# Patient Record
Sex: Male | Born: 1990 | Race: Black or African American | Hispanic: No | Marital: Single | Smoking: Never smoker
Health system: Southern US, Community
[De-identification: ages and names within clinical notes are randomized; demographics above are authoritative.]

## PROBLEM LIST (undated history)

## (undated) HISTORY — PX: BACK SURGERY: SHX140

---
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
## 2011-03-24 ENCOUNTER — Emergency Department (HOSPITAL_COMMUNITY): Payer: No Typology Code available for payment source

## 2011-03-24 ENCOUNTER — Inpatient Hospital Stay (HOSPITAL_COMMUNITY)
Admission: EM | Admit: 2011-03-24 | Discharge: 2011-04-16 | DRG: 981 | Disposition: A | Discharge status: Rehabilitation Facility | Payer: No Typology Code available for payment source | Attending: Surgery | Admitting: Surgery

## 2011-03-24 DIAGNOSIS — S27329A Contusion of lung, unspecified, initial encounter: Secondary | ICD-10-CM | POA: Diagnosis present

## 2011-03-24 DIAGNOSIS — I10 Essential (primary) hypertension: Secondary | ICD-10-CM | POA: Diagnosis not present

## 2011-03-24 DIAGNOSIS — E872 Acidosis, unspecified: Secondary | ICD-10-CM | POA: Diagnosis not present

## 2011-03-24 DIAGNOSIS — J9600 Acute respiratory failure, unspecified whether with hypoxia or hypercapnia: Secondary | ICD-10-CM | POA: Diagnosis present

## 2011-03-24 DIAGNOSIS — A0472 Enterocolitis due to Clostridium difficile, not specified as recurrent: Secondary | ICD-10-CM | POA: Diagnosis not present

## 2011-03-24 DIAGNOSIS — R509 Fever, unspecified: Secondary | ICD-10-CM | POA: Diagnosis not present

## 2011-03-24 DIAGNOSIS — S300XXA Contusion of lower back and pelvis, initial encounter: Secondary | ICD-10-CM | POA: Diagnosis present

## 2011-03-24 DIAGNOSIS — D62 Acute posthemorrhagic anemia: Secondary | ICD-10-CM | POA: Diagnosis present

## 2011-03-24 DIAGNOSIS — E46 Unspecified protein-calorie malnutrition: Secondary | ICD-10-CM | POA: Diagnosis not present

## 2011-03-24 DIAGNOSIS — D72829 Elevated white blood cell count, unspecified: Secondary | ICD-10-CM | POA: Diagnosis not present

## 2011-03-24 DIAGNOSIS — E876 Hypokalemia: Secondary | ICD-10-CM | POA: Diagnosis not present

## 2011-03-24 DIAGNOSIS — D696 Thrombocytopenia, unspecified: Secondary | ICD-10-CM | POA: Diagnosis not present

## 2011-03-24 DIAGNOSIS — R17 Unspecified jaundice: Secondary | ICD-10-CM | POA: Diagnosis not present

## 2011-03-24 DIAGNOSIS — Z8709 Personal history of other diseases of the respiratory system: Secondary | ICD-10-CM

## 2011-03-24 DIAGNOSIS — S36113A Laceration of liver, unspecified degree, initial encounter: Secondary | ICD-10-CM

## 2011-03-24 DIAGNOSIS — J95821 Acute postprocedural respiratory failure: Secondary | ICD-10-CM

## 2011-03-24 DIAGNOSIS — IMO0002 Reserved for concepts with insufficient information to code with codable children: Secondary | ICD-10-CM | POA: Diagnosis not present

## 2011-03-24 DIAGNOSIS — S32009A Unspecified fracture of unspecified lumbar vertebra, initial encounter for closed fracture: Secondary | ICD-10-CM | POA: Diagnosis present

## 2011-03-24 DIAGNOSIS — J151 Pneumonia due to Pseudomonas: Secondary | ICD-10-CM | POA: Diagnosis not present

## 2011-03-24 DIAGNOSIS — E861 Hypovolemia: Secondary | ICD-10-CM | POA: Diagnosis not present

## 2011-03-24 DIAGNOSIS — T17308A Unspecified foreign body in larynx causing other injury, initial encounter: Secondary | ICD-10-CM | POA: Diagnosis not present

## 2011-03-24 DIAGNOSIS — S272XXA Traumatic hemopneumothorax, initial encounter: Secondary | ICD-10-CM

## 2011-03-24 DIAGNOSIS — E874 Mixed disorder of acid-base balance: Secondary | ICD-10-CM | POA: Diagnosis not present

## 2011-03-24 DIAGNOSIS — N170 Acute kidney failure with tubular necrosis: Secondary | ICD-10-CM | POA: Diagnosis not present

## 2011-03-24 DIAGNOSIS — J9383 Other pneumothorax: Principal | ICD-10-CM | POA: Diagnosis present

## 2011-03-24 LAB — TROPONIN I: Troponin I: 3.32 ng/mL (ref <0.30)

## 2011-03-24 LAB — COMPREHENSIVE METABOLIC PANEL
Alkaline Phosphatase: 70 U/L (ref 39–117)
BUN: 13 mg/dL (ref 6–23)
Calcium: 8.2 mg/dL — ABNORMAL LOW (ref 8.4–10.5)
Creatinine, Ser: 1.21 mg/dL (ref 0.50–1.35)
GFR calc Af Amer: >60 mL/min (ref >60)
Glucose, Bld: 179 mg/dL — ABNORMAL HIGH (ref 70–99)
Potassium: 3.5 meq/L (ref 3.5–5.1)
Total Protein: 5.8 g/dL — ABNORMAL LOW (ref 6.0–8.3)

## 2011-03-24 LAB — CBC
HCT: 32.7 % — ABNORMAL LOW (ref 39.0–52.0)
Hemoglobin: 10.8 g/dL — ABNORMAL LOW (ref 13.0–17.0)
MCH: 29 pg (ref 26.0–34.0)
MCHC: 34.7 g/dL (ref 30.0–36.0)
MCV: 87.7 fL (ref 78.0–100.0)
Platelets: 129 10*3/uL — ABNORMAL LOW (ref 150–400)
RBC: 3.73 MIL/uL — ABNORMAL LOW (ref 4.22–5.81)
RDW: 14.1 % (ref 11.5–15.5)
WBC: 13.4 10*3/uL — ABNORMAL HIGH (ref 4.0–10.5)
WBC: 11.1 10*3/uL — ABNORMAL HIGH (ref 4.0–10.5)

## 2011-03-24 LAB — CK TOTAL AND CKMB (NOT AT ARMC)
CK, MB: 13.1 ng/mL (ref 0.3–4.0)
Relative Index: 2.3 (ref 0.0–2.5)
Total CK: 570 U/L — ABNORMAL HIGH (ref 7–232)

## 2011-03-24 LAB — POCT I-STAT, CHEM 8
BUN: 15 mg/dL (ref 6–23)
Chloride: 104 meq/L (ref 96–112)
Creatinine, Ser: 1.4 mg/dL — ABNORMAL HIGH (ref 0.50–1.35)
Sodium: 143 meq/L (ref 135–145)

## 2011-03-24 LAB — POCT I-STAT 3, ART BLOOD GAS (G3+)
O2 Saturation: 89 %
TCO2: 22 mmol/L (ref 0–100)
pCO2 arterial: 39.8 mmHg (ref 35.0–45.0)
pO2, Arterial: 61 mmHg — ABNORMAL LOW (ref 80.0–100.0)

## 2011-03-24 LAB — RAPID URINE DRUG SCREEN, HOSP PERFORMED
Amphetamines: NOT DETECTED
Cocaine: NOT DETECTED
Opiates: NOT DETECTED
Tetrahydrocannabinol: POSITIVE — AB

## 2011-03-24 LAB — HEMOGLOBIN AND HEMATOCRIT, BLOOD
HCT: 30.5 % — ABNORMAL LOW (ref 39.0–52.0)
Hemoglobin: 9.5 g/dL — ABNORMAL LOW (ref 13.0–17.0)
Hemoglobin: 11 g/dL — ABNORMAL LOW (ref 13.0–17.0)
Hemoglobin: 10.7 g/dL — ABNORMAL LOW (ref 13.0–17.0)

## 2011-03-24 LAB — BASIC METABOLIC PANEL
BUN: 13 mg/dL (ref 6–23)
Chloride: 111 mEq/L (ref 96–112)
GFR calc Af Amer: >60 mL/min (ref >60)
GFR calc non Af Amer: >60 mL/min (ref >60)
Potassium: 3.6 mEq/L (ref 3.5–5.1)
Sodium: 141 mEq/L (ref 135–145)

## 2011-03-24 LAB — LACTIC ACID, PLASMA: Lactic Acid, Venous: 8.3 mmol/L — ABNORMAL HIGH (ref 0.5–2.2)

## 2011-03-24 LAB — URINALYSIS, ROUTINE W REFLEX MICROSCOPIC
Ketones, ur: 15 mg/dL — AB
Nitrite: NEGATIVE
pH: 5.5 (ref 5.0–8.0)

## 2011-03-24 LAB — ABO/RH: ABO/RH(D): O POS

## 2011-03-24 LAB — URINE MICROSCOPIC-ADD ON

## 2011-03-24 MED ORDER — IOHEXOL 300 MG/ML  SOLN
100.0000 mL | Freq: Once | INTRAMUSCULAR | Status: AC | PRN
  Administered 2011-03-24: 100 mL via INTRAVENOUS

## 2011-03-25 ENCOUNTER — Inpatient Hospital Stay (HOSPITAL_COMMUNITY): Payer: No Typology Code available for payment source

## 2011-03-25 LAB — CBC
Hemoglobin: 10.4 g/dL — ABNORMAL LOW (ref 13.0–17.0)
MCH: 30 pg (ref 26.0–34.0)
MCHC: 35 g/dL (ref 30.0–36.0)
RDW: 13.7 % (ref 11.5–15.5)

## 2011-03-25 LAB — POCT I-STAT 3, ART BLOOD GAS (G3+)
Acid-Base Excess: 1 mmol/L (ref 0.0–2.0)
Acid-Base Excess: 2 mmol/L (ref 0.0–2.0)
Acid-Base Excess: 3 mmol/L — ABNORMAL HIGH (ref 0.0–2.0)
Bicarbonate: 25.9 meq/L — ABNORMAL HIGH (ref 20.0–24.0)
Bicarbonate: 28.5 meq/L — ABNORMAL HIGH (ref 20.0–24.0)
O2 Saturation: 94 %
Patient temperature: 98.9
Patient temperature: 99
Patient temperature: 100.4
TCO2: 27 mmol/L (ref 0–100)
TCO2: 30 mmol/L (ref 0–100)
TCO2: 30 mmol/L (ref 0–100)

## 2011-03-25 LAB — PHOSPHORUS: Phosphorus: 3.2 mg/dL (ref 2.3–4.6)

## 2011-03-25 LAB — BASIC METABOLIC PANEL
BUN: 12 mg/dL (ref 6–23)
Calcium: 8.1 mg/dL — ABNORMAL LOW (ref 8.4–10.5)
GFR calc Af Amer: >60 mL/min (ref >60)
GFR calc non Af Amer: >60 mL/min (ref >60)
Glucose, Bld: 127 mg/dL — ABNORMAL HIGH (ref 70–99)
Sodium: 137 mEq/L (ref 135–145)

## 2011-03-26 ENCOUNTER — Inpatient Hospital Stay (HOSPITAL_COMMUNITY): Payer: No Typology Code available for payment source

## 2011-03-26 DIAGNOSIS — S27329A Contusion of lung, unspecified, initial encounter: Secondary | ICD-10-CM

## 2011-03-26 DIAGNOSIS — J9819 Other pulmonary collapse: Secondary | ICD-10-CM

## 2011-03-26 DIAGNOSIS — J9600 Acute respiratory failure, unspecified whether with hypoxia or hypercapnia: Secondary | ICD-10-CM

## 2011-03-26 LAB — POCT I-STAT 3, ART BLOOD GAS (G3+)
Acid-base deficit: 1 mmol/L (ref 0.0–2.0)
Acid-base deficit: 2 mmol/L (ref 0.0–2.0)
Acid-base deficit: 3 mmol/L — ABNORMAL HIGH (ref 0.0–2.0)
Bicarbonate: 26.4 meq/L — ABNORMAL HIGH (ref 20.0–24.0)
Bicarbonate: 26.6 meq/L — ABNORMAL HIGH (ref 20.0–24.0)
Bicarbonate: 23.9 meq/L (ref 20.0–24.0)
Bicarbonate: 24 meq/L (ref 20.0–24.0)
O2 Saturation: 92 %
O2 Saturation: 69 %
O2 Saturation: 89 %
O2 Saturation: 100 %
O2 Saturation: 99 %
Patient temperature: 100.6
Patient temperature: 99
Patient temperature: 99
Patient temperature: 99
Patient temperature: 100.4
Patient temperature: 100.4
TCO2: 26 mmol/L (ref 0–100)
TCO2: 28 mmol/L (ref 0–100)
TCO2: 28 mmol/L (ref 0–100)
TCO2: 26 mmol/L (ref 0–100)
TCO2: 26 mmol/L (ref 0–100)
pCO2 arterial: 60.6 mmHg (ref 35.0–45.0)
pCO2 arterial: 68.6 mmHg (ref 35.0–45.0)
pCO2 arterial: 66.6 mmHg (ref 35.0–45.0)
pCO2 arterial: 52.4 mmHg — ABNORMAL HIGH (ref 35.0–45.0)
pH, Arterial: 7.249 — ABNORMAL LOW (ref 7.350–7.450)
pH, Arterial: 7.151 — CL (ref 7.350–7.450)
pH, Arterial: 7.198 — CL (ref 7.350–7.450)
pH, Arterial: 7.271 — ABNORMAL LOW (ref 7.350–7.450)
pO2, Arterial: 18 mmHg — CL (ref 80.0–100.0)
pO2, Arterial: 69 mmHg — ABNORMAL LOW (ref 80.0–100.0)
pO2, Arterial: 72 mmHg — ABNORMAL LOW (ref 80.0–100.0)
pO2, Arterial: 156 mmHg — ABNORMAL HIGH (ref 80.0–100.0)

## 2011-03-26 LAB — BASIC METABOLIC PANEL
BUN: 9 mg/dL (ref 6–23)
CO2: 26 mEq/L (ref 19–32)
Calcium: 8.6 mg/dL (ref 8.4–10.5)
Calcium: 8.2 mg/dL — ABNORMAL LOW (ref 8.4–10.5)
Chloride: 107 mEq/L (ref 96–112)
Creatinine, Ser: 0.81 mg/dL (ref 0.50–1.35)
Creatinine, Ser: 0.78 mg/dL (ref 0.50–1.35)
Creatinine, Ser: 1.2 mg/dL (ref 0.50–1.35)
GFR calc Af Amer: >60 mL/min (ref >60)
GFR calc Af Amer: >60 mL/min (ref >60)
GFR calc non Af Amer: >60 mL/min (ref >60)
GFR calc non Af Amer: >60 mL/min (ref >60)
GFR calc non Af Amer: >60 mL/min (ref >60)
Glucose, Bld: 141 mg/dL — ABNORMAL HIGH (ref 70–99)
Glucose, Bld: 123 mg/dL — ABNORMAL HIGH (ref 70–99)
Potassium: 3.5 mEq/L (ref 3.5–5.1)
Potassium: 5 mEq/L (ref 3.5–5.1)
Sodium: 135 mEq/L (ref 135–145)

## 2011-03-26 LAB — POCT I-STAT 3, VENOUS BLOOD GAS (G3P V)
Acid-base deficit: 3 mmol/L — ABNORMAL HIGH (ref 0.0–2.0)
Bicarbonate: 25.8 meq/L — ABNORMAL HIGH (ref 20.0–24.0)
O2 Saturation: 69 %
Patient temperature: 99.1

## 2011-03-26 LAB — APTT
aPTT: 25 seconds (ref 24–37)
aPTT: 31 seconds (ref 24–37)

## 2011-03-26 LAB — DIFFERENTIAL
Basophils Absolute: 0 10*3/uL (ref 0.0–0.1)
Basophils Relative: 0 % (ref 0–1)
Eosinophils Relative: 2 % (ref 0–5)
Lymphocytes Relative: 8 % — ABNORMAL LOW (ref 12–46)
Monocytes Absolute: 0.9 10*3/uL (ref 0.1–1.0)

## 2011-03-26 LAB — CBC
HCT: 25.2 % — ABNORMAL LOW (ref 39.0–52.0)
Hemoglobin: 9.3 g/dL — ABNORMAL LOW (ref 13.0–17.0)
MCH: 30.4 pg (ref 26.0–34.0)
MCHC: 34.9 g/dL (ref 30.0–36.0)
MCHC: 35.1 g/dL (ref 30.0–36.0)
MCV: 88.5 fL (ref 78.0–100.0)
Platelets: 116 10*3/uL — ABNORMAL LOW (ref 150–400)
Platelets: 129 10*3/uL — ABNORMAL LOW (ref 150–400)
RBC: 2.86 MIL/uL — ABNORMAL LOW (ref 4.22–5.81)
RDW: 13.9 % (ref 11.5–15.5)
RDW: 13.9 % (ref 11.5–15.5)
RDW: 13.8 % (ref 11.5–15.5)
WBC: 11.9 10*3/uL — ABNORMAL HIGH (ref 4.0–10.5)

## 2011-03-26 LAB — PROTIME-INR
Prothrombin Time: 17.5 seconds — ABNORMAL HIGH (ref 11.6–15.2)
Prothrombin Time: 18 seconds — ABNORMAL HIGH (ref 11.6–15.2)

## 2011-03-27 ENCOUNTER — Inpatient Hospital Stay (HOSPITAL_COMMUNITY): Payer: No Typology Code available for payment source

## 2011-03-27 LAB — GLUCOSE, CAPILLARY
Glucose-Capillary: 101 mg/dL — ABNORMAL HIGH (ref 70–99)
Glucose-Capillary: 110 mg/dL — ABNORMAL HIGH (ref 70–99)
Glucose-Capillary: 111 mg/dL — ABNORMAL HIGH (ref 70–99)
Glucose-Capillary: 120 mg/dL — ABNORMAL HIGH (ref 70–99)

## 2011-03-27 LAB — CBC
Platelets: 97 10*3/uL — ABNORMAL LOW (ref 150–400)
RBC: 2.55 MIL/uL — ABNORMAL LOW (ref 4.22–5.81)
WBC: 7.8 10*3/uL (ref 4.0–10.5)

## 2011-03-27 LAB — POCT I-STAT 3, ART BLOOD GAS (G3+)
Acid-base deficit: 4 mmol/L — ABNORMAL HIGH (ref 0.0–2.0)
Bicarbonate: 23.7 meq/L (ref 20.0–24.0)
Bicarbonate: 25.6 meq/L — ABNORMAL HIGH (ref 20.0–24.0)
Bicarbonate: 23.2 meq/L (ref 20.0–24.0)
O2 Saturation: 94 %
O2 Saturation: 91 %
O2 Saturation: 96 %
O2 Saturation: 95 %
Patient temperature: 97.7
Patient temperature: 97.5
TCO2: 25 mmol/L (ref 0–100)
TCO2: 25 mmol/L (ref 0–100)
TCO2: 25 mmol/L (ref 0–100)
pCO2 arterial: 48.4 mmHg — ABNORMAL HIGH (ref 35.0–45.0)
pCO2 arterial: 47.9 mmHg — ABNORMAL HIGH (ref 35.0–45.0)
pCO2 arterial: 49.7 mmHg — ABNORMAL HIGH (ref 35.0–45.0)
pCO2 arterial: 45 mmHg (ref 35.0–45.0)
pCO2 arterial: 42.5 mmHg (ref 35.0–45.0)
pH, Arterial: 7.293 — ABNORMAL LOW (ref 7.350–7.450)
pH, Arterial: 7.301 — ABNORMAL LOW (ref 7.350–7.450)
pH, Arterial: 7.288 — ABNORMAL LOW (ref 7.350–7.450)
pH, Arterial: 7.288 — ABNORMAL LOW (ref 7.350–7.450)
pH, Arterial: 7.319 — ABNORMAL LOW (ref 7.350–7.450)
pH, Arterial: 7.384 (ref 7.350–7.450)
pO2, Arterial: 82 mmHg (ref 80.0–100.0)
pO2, Arterial: 64 mmHg — ABNORMAL LOW (ref 80.0–100.0)

## 2011-03-27 LAB — BASIC METABOLIC PANEL
CO2: 25 mEq/L (ref 19–32)
Chloride: 109 mEq/L (ref 96–112)
Glucose, Bld: 128 mg/dL — ABNORMAL HIGH (ref 70–99)
Potassium: 4.5 mEq/L (ref 3.5–5.1)
Sodium: 139 mEq/L (ref 135–145)

## 2011-03-27 LAB — HEMOGLOBIN A1C
Hgb A1c MFr Bld: 5.7 % — ABNORMAL HIGH (ref <5.7)
Mean Plasma Glucose: 117 mg/dL — ABNORMAL HIGH (ref <117)

## 2011-03-27 LAB — PROTIME-INR: Prothrombin Time: 17.3 seconds — ABNORMAL HIGH (ref 11.6–15.2)

## 2011-03-27 LAB — APTT: aPTT: 30 seconds (ref 24–37)

## 2011-03-28 ENCOUNTER — Inpatient Hospital Stay (HOSPITAL_COMMUNITY): Payer: No Typology Code available for payment source

## 2011-03-28 DIAGNOSIS — S27329A Contusion of lung, unspecified, initial encounter: Secondary | ICD-10-CM

## 2011-03-28 DIAGNOSIS — J9600 Acute respiratory failure, unspecified whether with hypoxia or hypercapnia: Secondary | ICD-10-CM

## 2011-03-28 DIAGNOSIS — J9819 Other pulmonary collapse: Secondary | ICD-10-CM

## 2011-03-28 LAB — POCT I-STAT 3, ART BLOOD GAS (G3+)
Acid-base deficit: 4 mmol/L — ABNORMAL HIGH (ref 0.0–2.0)
Acid-base deficit: 4 mmol/L — ABNORMAL HIGH (ref 0.0–2.0)
Acid-base deficit: 4 mmol/L — ABNORMAL HIGH (ref 0.0–2.0)
Acid-base deficit: 4 mmol/L — ABNORMAL HIGH (ref 0.0–2.0)
Acid-base deficit: 5 mmol/L — ABNORMAL HIGH (ref 0.0–2.0)
Acid-base deficit: 6 mmol/L — ABNORMAL HIGH (ref 0.0–2.0)
Bicarbonate: 22.3 meq/L (ref 20.0–24.0)
Bicarbonate: 21.3 meq/L (ref 20.0–24.0)
Bicarbonate: 21.2 meq/L (ref 20.0–24.0)
Bicarbonate: 21.3 meq/L (ref 20.0–24.0)
Bicarbonate: 21.3 meq/L (ref 20.0–24.0)
O2 Saturation: 90 %
O2 Saturation: 93 %
O2 Saturation: 97 %
O2 Saturation: 95 %
O2 Saturation: 91 %
Patient temperature: 97.1
Patient temperature: 97.9
TCO2: 24 mmol/L (ref 0–100)
TCO2: 22 mmol/L (ref 0–100)
TCO2: 22 mmol/L (ref 0–100)
TCO2: 22 mmol/L (ref 0–100)
TCO2: 22 mmol/L (ref 0–100)
TCO2: 21 mmol/L (ref 0–100)
pCO2 arterial: 36.7 mmHg (ref 35.0–45.0)
pCO2 arterial: 35.7 mmHg (ref 35.0–45.0)
pCO2 arterial: 32.4 mmHg — ABNORMAL LOW (ref 35.0–45.0)
pCO2 arterial: 31.6 mmHg — ABNORMAL LOW (ref 35.0–45.0)
pCO2 arterial: 46.1 mmHg — ABNORMAL HIGH (ref 35.0–45.0)
pH, Arterial: 7.27 — ABNORMAL LOW (ref 7.350–7.450)
pH, Arterial: 7.282 — ABNORMAL LOW (ref 7.350–7.450)
pH, Arterial: 7.393 (ref 7.350–7.450)
pO2, Arterial: 40 mmHg — ABNORMAL LOW (ref 80.0–100.0)
pO2, Arterial: 47 mmHg — ABNORMAL LOW (ref 80.0–100.0)
pO2, Arterial: 53 mmHg — ABNORMAL LOW (ref 80.0–100.0)
pO2, Arterial: 59 mmHg — ABNORMAL LOW (ref 80.0–100.0)
pO2, Arterial: 64 mmHg — ABNORMAL LOW (ref 80.0–100.0)
pO2, Arterial: 66 mmHg — ABNORMAL LOW (ref 80.0–100.0)
pO2, Arterial: 87 mmHg (ref 80.0–100.0)

## 2011-03-28 LAB — TYPE AND SCREEN
ABO/RH(D): O POS
Antibody Screen: NEGATIVE
Unit division: 0
Unit division: 0
Unit division: 0
Unit division: 0
Unit division: 0
Unit division: 0

## 2011-03-28 LAB — DIFFERENTIAL
Basophils Relative: 0 % (ref 0–1)
Eosinophils Absolute: 0.2 10*3/uL (ref 0.0–0.7)
Lymphs Abs: 0.6 10*3/uL — ABNORMAL LOW (ref 0.7–4.0)
Monocytes Absolute: 1.1 10*3/uL — ABNORMAL HIGH (ref 0.1–1.0)
Monocytes Relative: 18 % — ABNORMAL HIGH (ref 3–12)

## 2011-03-28 LAB — VANCOMYCIN, TROUGH: Vancomycin Tr: 61.3 ug/mL (ref 10.0–20.0)

## 2011-03-28 LAB — COMPREHENSIVE METABOLIC PANEL
Albumin: 2.6 g/dL — ABNORMAL LOW (ref 3.5–5.2)
Alkaline Phosphatase: 51 U/L (ref 39–117)
BUN: 16 mg/dL (ref 6–23)
Chloride: 110 mEq/L (ref 96–112)
Creatinine, Ser: 1.15 mg/dL (ref 0.50–1.35)
GFR calc Af Amer: >60 mL/min (ref >60)
Glucose, Bld: 128 mg/dL — ABNORMAL HIGH (ref 70–99)
Potassium: 4.4 mEq/L (ref 3.5–5.1)
Total Bilirubin: 1.1 mg/dL (ref 0.3–1.2)
Total Protein: 5.4 g/dL — ABNORMAL LOW (ref 6.0–8.3)

## 2011-03-28 LAB — BASIC METABOLIC PANEL
CO2: 22 meq/L (ref 19–32)
Calcium: 8.7 mg/dL (ref 8.4–10.5)
Chloride: 109 meq/L (ref 96–112)
Potassium: 4.4 meq/L (ref 3.5–5.1)
Sodium: 140 meq/L (ref 135–145)

## 2011-03-28 LAB — CBC
MCH: 30 pg (ref 26.0–34.0)
MCHC: 33.8 g/dL (ref 30.0–36.0)
MCV: 88.9 fL (ref 78.0–100.0)
Platelets: 98 10*3/uL — ABNORMAL LOW (ref 150–400)

## 2011-03-28 LAB — GLUCOSE, CAPILLARY
Glucose-Capillary: 78 mg/dL (ref 70–99)
Glucose-Capillary: 104 mg/dL — ABNORMAL HIGH (ref 70–99)

## 2011-03-28 LAB — SODIUM, URINE, RANDOM: Sodium, Ur: 112 meq/L

## 2011-03-28 LAB — CREATININE, URINE, RANDOM: Creatinine, Urine: 20.12 mg/dL

## 2011-03-28 LAB — CHOLESTEROL, TOTAL: Cholesterol: 81 mg/dL (ref 0–200)

## 2011-03-28 LAB — MAGNESIUM: Magnesium: 2.3 mg/dL (ref 1.5–2.5)

## 2011-03-29 ENCOUNTER — Inpatient Hospital Stay (HOSPITAL_COMMUNITY): Payer: No Typology Code available for payment source

## 2011-03-29 DIAGNOSIS — S27329A Contusion of lung, unspecified, initial encounter: Secondary | ICD-10-CM

## 2011-03-29 DIAGNOSIS — J9600 Acute respiratory failure, unspecified whether with hypoxia or hypercapnia: Secondary | ICD-10-CM

## 2011-03-29 DIAGNOSIS — J9819 Other pulmonary collapse: Secondary | ICD-10-CM

## 2011-03-29 LAB — BLOOD GAS, ARTERIAL
Bicarbonate: 21.6 mEq/L (ref 20.0–24.0)
O2 Saturation: 90.7 %
Patient temperature: 98.6
TCO2: 23.1 mmol/L (ref 0–100)

## 2011-03-29 LAB — POCT I-STAT 3, ART BLOOD GAS (G3+)
Acid-base deficit: 6 mmol/L — ABNORMAL HIGH (ref 0.0–2.0)
Acid-base deficit: 6 mmol/L — ABNORMAL HIGH (ref 0.0–2.0)
Acid-base deficit: 6 mmol/L — ABNORMAL HIGH (ref 0.0–2.0)
Acid-base deficit: 7 mmol/L — ABNORMAL HIGH (ref 0.0–2.0)
Acid-base deficit: 7 mmol/L — ABNORMAL HIGH (ref 0.0–2.0)
Acid-base deficit: 7 mmol/L — ABNORMAL HIGH (ref 0.0–2.0)
Bicarbonate: 19.8 meq/L — ABNORMAL LOW (ref 20.0–24.0)
Bicarbonate: 20.2 meq/L (ref 20.0–24.0)
Bicarbonate: 20.3 meq/L (ref 20.0–24.0)
O2 Saturation: 100 %
O2 Saturation: 80 %
O2 Saturation: 86 %
O2 Saturation: 90 %
O2 Saturation: 97 %
O2 Saturation: 98 %
Patient temperature: 35.2
Patient temperature: 35.9
TCO2: 21 mmol/L (ref 0–100)
TCO2: 21 mmol/L (ref 0–100)
TCO2: 22 mmol/L (ref 0–100)
TCO2: 22 mmol/L (ref 0–100)
pCO2 arterial: 48.3 mmHg — ABNORMAL HIGH (ref 35.0–45.0)
pCO2 arterial: 46.9 mmHg — ABNORMAL HIGH (ref 35.0–45.0)
pCO2 arterial: 41.3 mmHg (ref 35.0–45.0)
pCO2 arterial: 41.1 mmHg (ref 35.0–45.0)
pCO2 arterial: 45.6 mmHg — ABNORMAL HIGH (ref 35.0–45.0)
pH, Arterial: 7.226 — ABNORMAL LOW (ref 7.350–7.450)
pO2, Arterial: 48 mmHg — ABNORMAL LOW (ref 80.0–100.0)
pO2, Arterial: 104 mmHg — ABNORMAL HIGH (ref 80.0–100.0)
pO2, Arterial: 58 mmHg — ABNORMAL LOW (ref 80.0–100.0)
pO2, Arterial: 108 mmHg — ABNORMAL HIGH (ref 80.0–100.0)
pO2, Arterial: 149 mmHg — ABNORMAL HIGH (ref 80.0–100.0)
pO2, Arterial: 99 mmHg (ref 80.0–100.0)

## 2011-03-29 LAB — PROTIME-INR: Prothrombin Time: 17.5 seconds — ABNORMAL HIGH (ref 11.6–15.2)

## 2011-03-29 LAB — RENAL FUNCTION PANEL
BUN: 33 mg/dL — ABNORMAL HIGH (ref 6–23)
CO2: 23 mEq/L (ref 19–32)
Chloride: 109 mEq/L (ref 96–112)
Creatinine, Ser: 3.12 mg/dL — ABNORMAL HIGH (ref 0.50–1.35)
GFR calc non Af Amer: 26 mL/min — ABNORMAL LOW (ref >60)

## 2011-03-29 LAB — CBC
HCT: 25.1 % — ABNORMAL LOW (ref 39.0–52.0)
MCV: 86.9 fL (ref 78.0–100.0)
Platelets: 112 10*3/uL — ABNORMAL LOW (ref 150–400)
RBC: 2.89 MIL/uL — ABNORMAL LOW (ref 4.22–5.81)
WBC: 8.2 10*3/uL (ref 4.0–10.5)

## 2011-03-29 LAB — GLUCOSE, CAPILLARY

## 2011-03-29 LAB — VANCOMYCIN, RANDOM: Vancomycin Rm: 45.9 ug/mL

## 2011-03-30 ENCOUNTER — Inpatient Hospital Stay (HOSPITAL_COMMUNITY): Payer: No Typology Code available for payment source

## 2011-03-30 LAB — POCT I-STAT 3, ART BLOOD GAS (G3+)
Acid-base deficit: 8 mmol/L — ABNORMAL HIGH (ref 0.0–2.0)
Acid-base deficit: 9 mmol/L — ABNORMAL HIGH (ref 0.0–2.0)
Acid-base deficit: 9 mmol/L — ABNORMAL HIGH (ref 0.0–2.0)
Bicarbonate: 21 meq/L (ref 20.0–24.0)
Bicarbonate: 19.3 meq/L — ABNORMAL LOW (ref 20.0–24.0)
Bicarbonate: 19 meq/L — ABNORMAL LOW (ref 20.0–24.0)
O2 Saturation: 96 %
O2 Saturation: 94 %
O2 Saturation: 93 %
O2 Saturation: 96 %
O2 Saturation: 97 %
Patient temperature: 37.3
TCO2: 23 mmol/L (ref 0–100)
TCO2: 21 mmol/L (ref 0–100)
TCO2: 19 mmol/L (ref 0–100)
TCO2: 19 mmol/L (ref 0–100)
pCO2 arterial: 77.4 mmHg (ref 35.0–45.0)
pCO2 arterial: 50.4 mmHg — ABNORMAL HIGH (ref 35.0–45.0)
pCO2 arterial: 33.7 mmHg — ABNORMAL LOW (ref 35.0–45.0)
pCO2 arterial: 40 mmHg (ref 35.0–45.0)
pCO2 arterial: 33.3 mmHg — ABNORMAL LOW (ref 35.0–45.0)
pCO2 arterial: 34.6 mmHg — ABNORMAL LOW (ref 35.0–45.0)
pH, Arterial: 7.329 — ABNORMAL LOW (ref 7.350–7.450)
pH, Arterial: 7.261 — ABNORMAL LOW (ref 7.350–7.450)
pH, Arterial: 7.354 (ref 7.350–7.450)
pH, Arterial: 7.368 (ref 7.350–7.450)
pO2, Arterial: 113 mmHg — ABNORMAL HIGH (ref 80.0–100.0)
pO2, Arterial: 100 mmHg (ref 80.0–100.0)
pO2, Arterial: 81 mmHg (ref 80.0–100.0)
pO2, Arterial: 83 mmHg (ref 80.0–100.0)
pO2, Arterial: 71 mmHg — ABNORMAL LOW (ref 80.0–100.0)
pO2, Arterial: 114 mmHg — ABNORMAL HIGH (ref 80.0–100.0)

## 2011-03-30 LAB — GLUCOSE, CAPILLARY
Glucose-Capillary: 123 mg/dL — ABNORMAL HIGH (ref 70–99)
Glucose-Capillary: 128 mg/dL — ABNORMAL HIGH (ref 70–99)
Glucose-Capillary: 124 mg/dL — ABNORMAL HIGH (ref 70–99)

## 2011-03-30 LAB — URINALYSIS, ROUTINE W REFLEX MICROSCOPIC
Bilirubin Urine: NEGATIVE
Glucose, UA: NEGATIVE mg/dL
Ketones, ur: NEGATIVE mg/dL
Protein, ur: 30 mg/dL — AB
pH: 7 (ref 5.0–8.0)

## 2011-03-30 LAB — DIFFERENTIAL
Basophils Absolute: 0 10*3/uL (ref 0.0–0.1)
Basophils Relative: 1 % (ref 0–1)
Eosinophils Absolute: 0.2 10*3/uL (ref 0.0–0.7)
Neutro Abs: 5.5 10*3/uL (ref 1.7–7.7)
Neutrophils Relative %: 70 % (ref 43–77)

## 2011-03-30 LAB — MAGNESIUM: Magnesium: 2.7 mg/dL — ABNORMAL HIGH (ref 1.5–2.5)

## 2011-03-30 LAB — CBC
Hemoglobin: 7.9 g/dL — ABNORMAL LOW (ref 13.0–17.0)
Platelets: 130 10*3/uL — ABNORMAL LOW (ref 150–400)
RBC: 2.72 MIL/uL — ABNORMAL LOW (ref 4.22–5.81)
WBC: 7.9 10*3/uL (ref 4.0–10.5)

## 2011-03-30 LAB — RENAL FUNCTION PANEL
CO2: 21 mEq/L (ref 19–32)
GFR calc Af Amer: 19 mL/min — ABNORMAL LOW (ref >60)
GFR calc non Af Amer: 16 mL/min — ABNORMAL LOW (ref >60)
Glucose, Bld: 137 mg/dL — ABNORMAL HIGH (ref 70–99)
Phosphorus: 4.8 mg/dL — ABNORMAL HIGH (ref 2.3–4.6)
Potassium: 4.7 mEq/L (ref 3.5–5.1)
Sodium: 139 mEq/L (ref 135–145)

## 2011-03-30 LAB — CK TOTAL AND CKMB (NOT AT ARMC)
CK, MB: 2.7 ng/mL (ref 0.3–4.0)
Relative Index: 1.6 (ref 0.0–2.5)
Total CK: 164 U/L (ref 7–232)

## 2011-03-30 LAB — URINE MICROSCOPIC-ADD ON

## 2011-03-31 ENCOUNTER — Inpatient Hospital Stay (HOSPITAL_COMMUNITY): Payer: No Typology Code available for payment source

## 2011-03-31 LAB — POCT I-STAT 3, ART BLOOD GAS (G3+)
Acid-base deficit: 5 mmol/L — ABNORMAL HIGH (ref 0.0–2.0)
Acid-base deficit: 4 mmol/L — ABNORMAL HIGH (ref 0.0–2.0)
Acid-base deficit: 5 mmol/L — ABNORMAL HIGH (ref 0.0–2.0)
Acid-base deficit: 3 mmol/L — ABNORMAL HIGH (ref 0.0–2.0)
Acid-base deficit: 5 mmol/L — ABNORMAL HIGH (ref 0.0–2.0)
Acid-base deficit: 4 mmol/L — ABNORMAL HIGH (ref 0.0–2.0)
Acid-base deficit: 4 mmol/L — ABNORMAL HIGH (ref 0.0–2.0)
Bicarbonate: 20.8 meq/L (ref 20.0–24.0)
Bicarbonate: 21.3 meq/L (ref 20.0–24.0)
Bicarbonate: 26.4 meq/L — ABNORMAL HIGH (ref 20.0–24.0)
Bicarbonate: 22.9 meq/L (ref 20.0–24.0)
Bicarbonate: 23 meq/L (ref 20.0–24.0)
O2 Saturation: 92 %
O2 Saturation: 92 %
O2 Saturation: 100 %
O2 Saturation: 96 %
O2 Saturation: 96 %
Patient temperature: 36.6
Patient temperature: 36.8
Patient temperature: 37.3
Patient temperature: 36.8
TCO2: 22 mmol/L (ref 0–100)
TCO2: 23 mmol/L (ref 0–100)
TCO2: 29 mmol/L (ref 0–100)
TCO2: 25 mmol/L (ref 0–100)
TCO2: 28 mmol/L (ref 0–100)
TCO2: 24 mmol/L (ref 0–100)
pCO2 arterial: 59.1 mmHg (ref 35.0–45.0)
pCO2 arterial: 53.4 mmHg — ABNORMAL HIGH (ref 35.0–45.0)
pH, Arterial: 7.238 — ABNORMAL LOW (ref 7.350–7.450)
pH, Arterial: 7.266 — ABNORMAL LOW (ref 7.350–7.450)
pH, Arterial: 7.205 — ABNORMAL LOW (ref 7.350–7.450)
pO2, Arterial: 64 mmHg — ABNORMAL LOW (ref 80.0–100.0)
pO2, Arterial: 66 mmHg — ABNORMAL LOW (ref 80.0–100.0)
pO2, Arterial: 324 mmHg — ABNORMAL HIGH (ref 80.0–100.0)
pO2, Arterial: 101 mmHg — ABNORMAL HIGH (ref 80.0–100.0)
pO2, Arterial: 97 mmHg (ref 80.0–100.0)
pO2, Arterial: 73 mmHg — ABNORMAL LOW (ref 80.0–100.0)

## 2011-03-31 LAB — BLOOD GAS, ARTERIAL
Acid-base deficit: 3.5 mmol/L — ABNORMAL HIGH (ref 0.0–2.0)
Amplitude: 119
Drawn by: 252031
FIO2: 0.5 %
TCO2: 22.2 mmol/L (ref 0–100)
pCO2 arterial: 38 mmHg (ref 35.0–45.0)

## 2011-03-31 LAB — PHOSPHORUS: Phosphorus: 2.8 mg/dL (ref 2.3–4.6)

## 2011-03-31 LAB — BASIC METABOLIC PANEL
GFR calc Af Amer: 14 mL/min — ABNORMAL LOW (ref >60)
GFR calc non Af Amer: 12 mL/min — ABNORMAL LOW (ref >60)
Glucose, Bld: 128 mg/dL — ABNORMAL HIGH (ref 70–99)
Potassium: 3.1 mEq/L — ABNORMAL LOW (ref 3.5–5.1)
Sodium: 141 mEq/L (ref 135–145)

## 2011-03-31 LAB — URINE CULTURE
Colony Count: NO GROWTH
Culture  Setup Time: 201208181733
Culture: NO GROWTH

## 2011-03-31 LAB — MAGNESIUM: Magnesium: 2.6 mg/dL — ABNORMAL HIGH (ref 1.5–2.5)

## 2011-03-31 LAB — CBC
Hemoglobin: 6.7 g/dL — CL (ref 13.0–17.0)
MCH: 28.6 pg (ref 26.0–34.0)
RBC: 2.34 MIL/uL — ABNORMAL LOW (ref 4.22–5.81)

## 2011-03-31 LAB — VANCOMYCIN, RANDOM
Vancomycin Rm: 38.1 ug/mL
Vancomycin Rm: 31.2 ug/mL

## 2011-03-31 LAB — HEPATITIS B SURFACE ANTIBODY,QUALITATIVE: Hep B S Ab: POSITIVE — AB

## 2011-03-31 LAB — GLUCOSE, CAPILLARY: Glucose-Capillary: 105 mg/dL — ABNORMAL HIGH (ref 70–99)

## 2011-03-31 LAB — HEMOGLOBIN AND HEMATOCRIT, BLOOD
HCT: 18.9 % — ABNORMAL LOW (ref 39.0–52.0)
Hemoglobin: 6.7 g/dL — CL (ref 13.0–17.0)

## 2011-04-01 ENCOUNTER — Inpatient Hospital Stay (HOSPITAL_COMMUNITY): Payer: No Typology Code available for payment source

## 2011-04-01 LAB — DIFFERENTIAL
Basophils Relative: 1 % (ref 0–1)
Eosinophils Relative: 2 % (ref 0–5)
Lymphocytes Relative: 6 % — ABNORMAL LOW (ref 12–46)
Monocytes Relative: 16 % — ABNORMAL HIGH (ref 3–12)
Neutrophils Relative %: 75 % (ref 43–77)

## 2011-04-01 LAB — GLUCOSE, CAPILLARY
Glucose-Capillary: 119 mg/dL — ABNORMAL HIGH (ref 70–99)
Glucose-Capillary: 121 mg/dL — ABNORMAL HIGH (ref 70–99)
Glucose-Capillary: 122 mg/dL — ABNORMAL HIGH (ref 70–99)
Glucose-Capillary: 124 mg/dL — ABNORMAL HIGH (ref 70–99)

## 2011-04-01 LAB — POCT I-STAT 3, ART BLOOD GAS (G3+)
Acid-base deficit: 5 mmol/L — ABNORMAL HIGH (ref 0.0–2.0)
Bicarbonate: 22.9 meq/L (ref 20.0–24.0)
Bicarbonate: 22.1 meq/L (ref 20.0–24.0)
O2 Saturation: 88 %
O2 Saturation: 100 %
Patient temperature: 36.8
TCO2: 25 mmol/L (ref 0–100)
TCO2: 25 mmol/L (ref 0–100)
pCO2 arterial: 56.8 mmHg — ABNORMAL HIGH (ref 35.0–45.0)
pCO2 arterial: 57.8 mmHg (ref 35.0–45.0)
pCO2 arterial: 59.3 mmHg (ref 35.0–45.0)
pCO2 arterial: 47.2 mmHg — ABNORMAL HIGH (ref 35.0–45.0)
pH, Arterial: 7.214 — ABNORMAL LOW (ref 7.350–7.450)
pO2, Arterial: 66 mmHg — ABNORMAL LOW (ref 80.0–100.0)
pO2, Arterial: 244 mmHg — ABNORMAL HIGH (ref 80.0–100.0)
pO2, Arterial: 66 mmHg — ABNORMAL LOW (ref 80.0–100.0)

## 2011-04-01 LAB — CROSSMATCH
Antibody Screen: NEGATIVE
Unit division: 0

## 2011-04-01 LAB — COMPREHENSIVE METABOLIC PANEL
ALT: 24 U/L (ref 0–53)
Albumin: 2 g/dL — ABNORMAL LOW (ref 3.5–5.2)
Alkaline Phosphatase: 57 U/L (ref 39–117)
Chloride: 103 mEq/L (ref 96–112)
GFR calc Af Amer: 12 mL/min — ABNORMAL LOW (ref >60)
Glucose, Bld: 128 mg/dL — ABNORMAL HIGH (ref 70–99)
Potassium: 4 mEq/L (ref 3.5–5.1)
Sodium: 139 mEq/L (ref 135–145)
Total Protein: 5.7 g/dL — ABNORMAL LOW (ref 6.0–8.3)

## 2011-04-01 LAB — CBC
HCT: 22.7 % — ABNORMAL LOW (ref 39.0–52.0)
Hemoglobin: 7.7 g/dL — ABNORMAL LOW (ref 13.0–17.0)
RDW: 15.8 % — ABNORMAL HIGH (ref 11.5–15.5)
WBC: 10.9 10*3/uL — ABNORMAL HIGH (ref 4.0–10.5)

## 2011-04-02 ENCOUNTER — Inpatient Hospital Stay (HOSPITAL_COMMUNITY): Payer: No Typology Code available for payment source

## 2011-04-02 DIAGNOSIS — J9819 Other pulmonary collapse: Secondary | ICD-10-CM

## 2011-04-02 DIAGNOSIS — J9600 Acute respiratory failure, unspecified whether with hypoxia or hypercapnia: Secondary | ICD-10-CM

## 2011-04-02 DIAGNOSIS — S27329A Contusion of lung, unspecified, initial encounter: Secondary | ICD-10-CM

## 2011-04-02 LAB — RENAL FUNCTION PANEL
Albumin: 1.9 g/dL — ABNORMAL LOW (ref 3.5–5.2)
Albumin: 2.1 g/dL — ABNORMAL LOW (ref 3.5–5.2)
BUN: 67 mg/dL — ABNORMAL HIGH (ref 6–23)
BUN: 54 mg/dL — ABNORMAL HIGH (ref 6–23)
CO2: 25 mEq/L (ref 19–32)
Calcium: 8.6 mg/dL (ref 8.4–10.5)
Calcium: 8.8 mg/dL (ref 8.4–10.5)
Chloride: 101 mEq/L (ref 96–112)
Creatinine, Ser: 4.54 mg/dL — ABNORMAL HIGH (ref 0.50–1.35)
Creatinine, Ser: 3.37 mg/dL — ABNORMAL HIGH (ref 0.50–1.35)
GFR calc non Af Amer: 17 mL/min — ABNORMAL LOW (ref >60)

## 2011-04-02 LAB — CBC
MCH: 29.6 pg (ref 26.0–34.0)
MCHC: 34.7 g/dL (ref 30.0–36.0)
MCV: 85.2 fL (ref 78.0–100.0)
Platelets: 128 10*3/uL — ABNORMAL LOW (ref 150–400)

## 2011-04-02 LAB — DIFFERENTIAL
Basophils Absolute: 0 10*3/uL (ref 0.0–0.1)
Lymphs Abs: 1.5 10*3/uL (ref 0.7–4.0)
Monocytes Absolute: 0.5 10*3/uL (ref 0.1–1.0)
Neutro Abs: 9.1 10*3/uL — ABNORMAL HIGH (ref 1.7–7.7)

## 2011-04-02 LAB — POCT I-STAT 3, ART BLOOD GAS (G3+)
Acid-base deficit: 1 mmol/L (ref 0.0–2.0)
Patient temperature: 97.7
Patient temperature: 36.7
TCO2: 26 mmol/L (ref 0–100)
pCO2 arterial: 48.1 mmHg — ABNORMAL HIGH (ref 35.0–45.0)
pH, Arterial: 7.322 — ABNORMAL LOW (ref 7.350–7.450)
pO2, Arterial: 151 mmHg — ABNORMAL HIGH (ref 80.0–100.0)

## 2011-04-02 LAB — GLUCOSE, CAPILLARY
Glucose-Capillary: 101 mg/dL — ABNORMAL HIGH (ref 70–99)
Glucose-Capillary: 106 mg/dL — ABNORMAL HIGH (ref 70–99)
Glucose-Capillary: 104 mg/dL — ABNORMAL HIGH (ref 70–99)

## 2011-04-02 LAB — APTT: aPTT: 33 seconds (ref 24–37)

## 2011-04-03 ENCOUNTER — Inpatient Hospital Stay (HOSPITAL_COMMUNITY): Payer: No Typology Code available for payment source

## 2011-04-03 LAB — CBC
HCT: 20.9 % — ABNORMAL LOW (ref 39.0–52.0)
Hemoglobin: 7.4 g/dL — ABNORMAL LOW (ref 13.0–17.0)
MCV: 83.6 fL (ref 78.0–100.0)
MCV: 83.1 fL (ref 78.0–100.0)
Platelets: 158 10*3/uL (ref 150–400)
RDW: 15.5 % (ref 11.5–15.5)
WBC: 14.3 10*3/uL — ABNORMAL HIGH (ref 4.0–10.5)
WBC: 17.3 10*3/uL — ABNORMAL HIGH (ref 4.0–10.5)

## 2011-04-03 LAB — RENAL FUNCTION PANEL
Albumin: 2.1 g/dL — ABNORMAL LOW (ref 3.5–5.2)
BUN: 51 mg/dL — ABNORMAL HIGH (ref 6–23)
BUN: 52 mg/dL — ABNORMAL HIGH (ref 6–23)
CO2: 24 mEq/L (ref 19–32)
GFR calc Af Amer: 30 mL/min — ABNORMAL LOW (ref >60)
GFR calc non Af Amer: 30 mL/min — ABNORMAL LOW (ref >60)
Glucose, Bld: 109 mg/dL — ABNORMAL HIGH (ref 70–99)
Phosphorus: 3 mg/dL (ref 2.3–4.6)
Potassium: 3.3 mEq/L — ABNORMAL LOW (ref 3.5–5.1)
Potassium: 3.7 mEq/L (ref 3.5–5.1)
Sodium: 133 mEq/L — ABNORMAL LOW (ref 135–145)
Sodium: 134 mEq/L — ABNORMAL LOW (ref 135–145)

## 2011-04-03 LAB — POCT I-STAT 3, ART BLOOD GAS (G3+)
Acid-base deficit: 1 mmol/L (ref 0.0–2.0)
Bicarbonate: 24 meq/L (ref 20.0–24.0)
TCO2: 25 mmol/L (ref 0–100)
pH, Arterial: 7.393 (ref 7.350–7.450)
pO2, Arterial: 101 mmHg — ABNORMAL HIGH (ref 80.0–100.0)

## 2011-04-03 LAB — MAGNESIUM: Magnesium: 2.6 mg/dL — ABNORMAL HIGH (ref 1.5–2.5)

## 2011-04-03 LAB — GLUCOSE, CAPILLARY
Glucose-Capillary: 108 mg/dL — ABNORMAL HIGH (ref 70–99)
Glucose-Capillary: 115 mg/dL — ABNORMAL HIGH (ref 70–99)
Glucose-Capillary: 108 mg/dL — ABNORMAL HIGH (ref 70–99)

## 2011-04-04 ENCOUNTER — Inpatient Hospital Stay (HOSPITAL_COMMUNITY): Payer: No Typology Code available for payment source

## 2011-04-04 DIAGNOSIS — S27329A Contusion of lung, unspecified, initial encounter: Secondary | ICD-10-CM

## 2011-04-04 DIAGNOSIS — J9819 Other pulmonary collapse: Secondary | ICD-10-CM

## 2011-04-04 DIAGNOSIS — J9600 Acute respiratory failure, unspecified whether with hypoxia or hypercapnia: Secondary | ICD-10-CM

## 2011-04-04 LAB — POCT ACTIVATED CLOTTING TIME
Activated Clotting Time: 138 s
Activated Clotting Time: 155 s
Activated Clotting Time: 155 s
Activated Clotting Time: 155 s
Activated Clotting Time: 171 s
Activated Clotting Time: 171 s
Activated Clotting Time: 171 s
Activated Clotting Time: 171 s
Activated Clotting Time: 171 s
Activated Clotting Time: 171 s
Activated Clotting Time: 177 s
Activated Clotting Time: 171 s
Activated Clotting Time: 182 s
Activated Clotting Time: 177 s
Activated Clotting Time: 177 s

## 2011-04-04 LAB — GLUCOSE, CAPILLARY
Glucose-Capillary: 108 mg/dL — ABNORMAL HIGH (ref 70–99)
Glucose-Capillary: 105 mg/dL — ABNORMAL HIGH (ref 70–99)

## 2011-04-04 LAB — POCT I-STAT 3, ART BLOOD GAS (G3+)
Acid-base deficit: 1 mmol/L (ref 0.0–2.0)
O2 Saturation: 92 %
TCO2: 27 mmol/L (ref 0–100)
pCO2 arterial: 43.5 mmHg (ref 35.0–45.0)
pCO2 arterial: 48.3 mmHg — ABNORMAL HIGH (ref 35.0–45.0)
pH, Arterial: 7.37 (ref 7.350–7.450)
pO2, Arterial: 71 mmHg — ABNORMAL LOW (ref 80.0–100.0)

## 2011-04-04 LAB — CULTURE, BAL-QUANTITATIVE W GRAM STAIN: Gram Stain: NONE SEEN

## 2011-04-04 LAB — DIFFERENTIAL
Basophils Absolute: 0.2 10*3/uL — ABNORMAL HIGH (ref 0.0–0.1)
Lymphs Abs: 1.5 10*3/uL (ref 0.7–4.0)
Monocytes Absolute: 0.8 10*3/uL (ref 0.1–1.0)
Monocytes Relative: 4 % (ref 3–12)

## 2011-04-04 LAB — RENAL FUNCTION PANEL
Albumin: 2 g/dL — ABNORMAL LOW (ref 3.5–5.2)
BUN: 49 mg/dL — ABNORMAL HIGH (ref 6–23)
Calcium: 8.9 mg/dL (ref 8.4–10.5)
Creatinine, Ser: 2.53 mg/dL — ABNORMAL HIGH (ref 0.50–1.35)
Phosphorus: 1.6 mg/dL — ABNORMAL LOW (ref 2.3–4.6)
Potassium: 3.5 mEq/L (ref 3.5–5.1)

## 2011-04-04 LAB — COMPREHENSIVE METABOLIC PANEL
AST: 70 U/L — ABNORMAL HIGH (ref 0–37)
Albumin: 2 g/dL — ABNORMAL LOW (ref 3.5–5.2)
Calcium: 8.8 mg/dL (ref 8.4–10.5)
Creatinine, Ser: 2.53 mg/dL — ABNORMAL HIGH (ref 0.50–1.35)
Total Protein: 6 g/dL (ref 6.0–8.3)

## 2011-04-04 LAB — APTT: aPTT: 47 seconds — ABNORMAL HIGH (ref 24–37)

## 2011-04-04 LAB — PHOSPHORUS: Phosphorus: 2.9 mg/dL (ref 2.3–4.6)

## 2011-04-04 LAB — CLOSTRIDIUM DIFFICILE BY PCR: Toxigenic C. Difficile by PCR: POSITIVE — AB

## 2011-04-04 LAB — CBC
MCH: 29.4 pg (ref 26.0–34.0)
MCHC: 35.6 g/dL (ref 30.0–36.0)
Platelets: 154 10*3/uL (ref 150–400)
RBC: 2.62 MIL/uL — ABNORMAL LOW (ref 4.22–5.81)

## 2011-04-04 LAB — BILIRUBIN, FRACTIONATED(TOT/DIR/INDIR): Total Bilirubin: 5.1 mg/dL — ABNORMAL HIGH (ref 0.3–1.2)

## 2011-04-04 MED ORDER — IOHEXOL 300 MG/ML  SOLN
20.0000 mL | Freq: Once | INTRAMUSCULAR | Status: AC | PRN
  Administered 2011-04-04: 20 mL

## 2011-04-05 ENCOUNTER — Inpatient Hospital Stay (HOSPITAL_COMMUNITY): Payer: No Typology Code available for payment source

## 2011-04-05 LAB — POCT ACTIVATED CLOTTING TIME
Activated Clotting Time: 182 s
Activated Clotting Time: 182 s
Activated Clotting Time: 188 s
Activated Clotting Time: 177 s
Activated Clotting Time: 188 s
Activated Clotting Time: 188 s
Activated Clotting Time: 182 s
Activated Clotting Time: 193 s

## 2011-04-05 LAB — GLUCOSE, CAPILLARY: Glucose-Capillary: 103 mg/dL — ABNORMAL HIGH (ref 70–99)

## 2011-04-05 LAB — POCT I-STAT 3, ART BLOOD GAS (G3+)
Acid-base deficit: 1 mmol/L (ref 0.0–2.0)
Bicarbonate: 26.1 meq/L — ABNORMAL HIGH (ref 20.0–24.0)
Bicarbonate: 25 meq/L — ABNORMAL HIGH (ref 20.0–24.0)
O2 Saturation: 96 %
Patient temperature: 37.8
Patient temperature: 37.8
pCO2 arterial: 51.6 mmHg — ABNORMAL HIGH (ref 35.0–45.0)
pO2, Arterial: 87 mmHg (ref 80.0–100.0)

## 2011-04-05 LAB — PHOSPHORUS: Phosphorus: 2.2 mg/dL — ABNORMAL LOW (ref 2.3–4.6)

## 2011-04-05 LAB — COMPREHENSIVE METABOLIC PANEL
Alkaline Phosphatase: 135 U/L — ABNORMAL HIGH (ref 39–117)
BUN: 48 mg/dL — ABNORMAL HIGH (ref 6–23)
CO2: 26 mEq/L (ref 19–32)
Chloride: 103 mEq/L (ref 96–112)
Creatinine, Ser: 2.43 mg/dL — ABNORMAL HIGH (ref 0.50–1.35)
GFR calc Af Amer: 41 mL/min — ABNORMAL LOW (ref >60)
GFR calc non Af Amer: 34 mL/min — ABNORMAL LOW (ref >60)
Glucose, Bld: 115 mg/dL — ABNORMAL HIGH (ref 70–99)
Potassium: 4.2 mEq/L (ref 3.5–5.1)
Total Bilirubin: 4.3 mg/dL — ABNORMAL HIGH (ref 0.3–1.2)

## 2011-04-05 LAB — CBC
HCT: 21.9 % — ABNORMAL LOW (ref 39.0–52.0)
Hemoglobin: 7.8 g/dL — ABNORMAL LOW (ref 13.0–17.0)
MCV: 83.9 fL (ref 78.0–100.0)
WBC: 20.7 10*3/uL — ABNORMAL HIGH (ref 4.0–10.5)

## 2011-04-05 LAB — CULTURE, BLOOD (ROUTINE X 2)
Culture  Setup Time: 201208181733
Culture: NO GROWTH

## 2011-04-05 LAB — MAGNESIUM: Magnesium: 2.9 mg/dL — ABNORMAL HIGH (ref 1.5–2.5)

## 2011-04-06 ENCOUNTER — Inpatient Hospital Stay (HOSPITAL_COMMUNITY): Payer: No Typology Code available for payment source

## 2011-04-06 LAB — CBC
Hemoglobin: 7.1 g/dL — ABNORMAL LOW (ref 13.0–17.0)
MCH: 29.1 pg (ref 26.0–34.0)
MCV: 84 fL (ref 78.0–100.0)
MCV: 83.2 fL (ref 78.0–100.0)
Platelets: 202 10*3/uL (ref 150–400)
Platelets: 220 10*3/uL (ref 150–400)
RBC: 2.37 MIL/uL — ABNORMAL LOW (ref 4.22–5.81)
RBC: 2.44 MIL/uL — ABNORMAL LOW (ref 4.22–5.81)
RDW: 16.3 % — ABNORMAL HIGH (ref 11.5–15.5)
WBC: 19.9 10*3/uL — ABNORMAL HIGH (ref 4.0–10.5)
WBC: 20.9 10*3/uL — ABNORMAL HIGH (ref 4.0–10.5)

## 2011-04-06 LAB — BASIC METABOLIC PANEL
BUN: 118 mg/dL — ABNORMAL HIGH (ref 6–23)
CO2: 22 mEq/L (ref 19–32)
Chloride: 103 mEq/L (ref 96–112)
GFR calc Af Amer: 15 mL/min — ABNORMAL LOW (ref >60)
Potassium: 3.9 mEq/L (ref 3.5–5.1)

## 2011-04-06 LAB — COMPREHENSIVE METABOLIC PANEL
ALT: 59 U/L — ABNORMAL HIGH (ref 0–53)
AST: 63 U/L — ABNORMAL HIGH (ref 0–37)
Albumin: 1.9 g/dL — ABNORMAL LOW (ref 3.5–5.2)
Alkaline Phosphatase: 132 U/L — ABNORMAL HIGH (ref 39–117)
CO2: 25 mEq/L (ref 19–32)
Chloride: 100 mEq/L (ref 96–112)
GFR calc non Af Amer: 16 mL/min — ABNORMAL LOW (ref >60)
Potassium: 3.7 mEq/L (ref 3.5–5.1)
Sodium: 135 mEq/L (ref 135–145)
Total Bilirubin: 2.7 mg/dL — ABNORMAL HIGH (ref 0.3–1.2)

## 2011-04-06 LAB — POCT I-STAT 3, ART BLOOD GAS (G3+)
Patient temperature: 37.8
TCO2: 25 mmol/L (ref 0–100)
pCO2 arterial: 40 mmHg (ref 35.0–45.0)
pH, Arterial: 7.382 (ref 7.350–7.450)

## 2011-04-06 LAB — MAGNESIUM: Magnesium: 3 mg/dL — ABNORMAL HIGH (ref 1.5–2.5)

## 2011-04-07 ENCOUNTER — Inpatient Hospital Stay (HOSPITAL_COMMUNITY): Payer: No Typology Code available for payment source

## 2011-04-07 DIAGNOSIS — J9600 Acute respiratory failure, unspecified whether with hypoxia or hypercapnia: Secondary | ICD-10-CM

## 2011-04-07 DIAGNOSIS — S27329A Contusion of lung, unspecified, initial encounter: Secondary | ICD-10-CM

## 2011-04-07 DIAGNOSIS — G9340 Encephalopathy, unspecified: Secondary | ICD-10-CM

## 2011-04-07 LAB — CBC
HCT: 19.8 % — ABNORMAL LOW (ref 39.0–52.0)
HCT: 19.4 % — ABNORMAL LOW (ref 39.0–52.0)
Hemoglobin: 7 g/dL — ABNORMAL LOW (ref 13.0–17.0)
MCH: 29.2 pg (ref 26.0–34.0)
Platelets: 169 10*3/uL (ref 150–400)
RBC: 2.4 MIL/uL — ABNORMAL LOW (ref 4.22–5.81)
RDW: 16.6 % — ABNORMAL HIGH (ref 11.5–15.5)
WBC: 22.2 10*3/uL — ABNORMAL HIGH (ref 4.0–10.5)

## 2011-04-07 LAB — GLUCOSE, CAPILLARY
Glucose-Capillary: 109 mg/dL — ABNORMAL HIGH (ref 70–99)
Glucose-Capillary: 97 mg/dL (ref 70–99)
Glucose-Capillary: 99 mg/dL (ref 70–99)

## 2011-04-07 LAB — BASIC METABOLIC PANEL
BUN: 137 mg/dL — ABNORMAL HIGH (ref 6–23)
CO2: 23 mEq/L (ref 19–32)
Glucose, Bld: 100 mg/dL — ABNORMAL HIGH (ref 70–99)
Potassium: 4.3 mEq/L (ref 3.5–5.1)
Sodium: 139 mEq/L (ref 135–145)

## 2011-04-08 ENCOUNTER — Inpatient Hospital Stay (HOSPITAL_COMMUNITY): Payer: No Typology Code available for payment source

## 2011-04-08 DIAGNOSIS — X58XXXA Exposure to other specified factors, initial encounter: Secondary | ICD-10-CM

## 2011-04-08 DIAGNOSIS — J9600 Acute respiratory failure, unspecified whether with hypoxia or hypercapnia: Secondary | ICD-10-CM

## 2011-04-08 DIAGNOSIS — S27329A Contusion of lung, unspecified, initial encounter: Secondary | ICD-10-CM

## 2011-04-08 LAB — CROSSMATCH
ABO/RH(D): O POS
Antibody Screen: NEGATIVE
Unit division: 0

## 2011-04-08 LAB — GLUCOSE, CAPILLARY
Glucose-Capillary: 105 mg/dL — ABNORMAL HIGH (ref 70–99)
Glucose-Capillary: 105 mg/dL — ABNORMAL HIGH (ref 70–99)
Glucose-Capillary: 120 mg/dL — ABNORMAL HIGH (ref 70–99)

## 2011-04-08 LAB — RENAL FUNCTION PANEL
CO2: 25 mEq/L (ref 19–32)
Chloride: 100 mEq/L (ref 96–112)
GFR calc Af Amer: 16 mL/min — ABNORMAL LOW (ref >60)
GFR calc non Af Amer: 13 mL/min — ABNORMAL LOW (ref >60)
Glucose, Bld: 124 mg/dL — ABNORMAL HIGH (ref 70–99)
Potassium: 4.3 mEq/L (ref 3.5–5.1)
Sodium: 139 mEq/L (ref 135–145)

## 2011-04-08 LAB — CBC
Hemoglobin: 7.5 g/dL — ABNORMAL LOW (ref 13.0–17.0)
RBC: 2.63 MIL/uL — ABNORMAL LOW (ref 4.22–5.81)
WBC: 20 10*3/uL — ABNORMAL HIGH (ref 4.0–10.5)

## 2011-04-08 LAB — MAGNESIUM: Magnesium: 2.8 mg/dL — ABNORMAL HIGH (ref 1.5–2.5)

## 2011-04-09 ENCOUNTER — Inpatient Hospital Stay (HOSPITAL_COMMUNITY): Payer: No Typology Code available for payment source

## 2011-04-09 LAB — URINALYSIS, ROUTINE W REFLEX MICROSCOPIC
Glucose, UA: NEGATIVE mg/dL
Protein, ur: 30 mg/dL — AB
Specific Gravity, Urine: 1.004 — ABNORMAL LOW (ref 1.005–1.030)
pH: 7 (ref 5.0–8.0)

## 2011-04-09 LAB — COMPREHENSIVE METABOLIC PANEL
Albumin: 2.1 g/dL — ABNORMAL LOW (ref 3.5–5.2)
Alkaline Phosphatase: 120 U/L — ABNORMAL HIGH (ref 39–117)
BUN: 83 mg/dL — ABNORMAL HIGH (ref 6–23)
Chloride: 99 mEq/L (ref 96–112)
Creatinine, Ser: 4.21 mg/dL — ABNORMAL HIGH (ref 0.50–1.35)
GFR calc Af Amer: 22 mL/min — ABNORMAL LOW (ref >60)
Glucose, Bld: 111 mg/dL — ABNORMAL HIGH (ref 70–99)
Total Bilirubin: 1.3 mg/dL — ABNORMAL HIGH (ref 0.3–1.2)
Total Protein: 7 g/dL (ref 6.0–8.3)

## 2011-04-09 LAB — GLUCOSE, CAPILLARY
Glucose-Capillary: 114 mg/dL — ABNORMAL HIGH (ref 70–99)
Glucose-Capillary: 107 mg/dL — ABNORMAL HIGH (ref 70–99)

## 2011-04-09 LAB — POCT I-STAT 3, ART BLOOD GAS (G3+)
Bicarbonate: 24.7 meq/L — ABNORMAL HIGH (ref 20.0–24.0)
O2 Saturation: 98 %
Patient temperature: 37.4
TCO2: 26 mmol/L (ref 0–100)
pO2, Arterial: 107 mmHg — ABNORMAL HIGH (ref 80.0–100.0)

## 2011-04-09 LAB — CBC
HCT: 21.6 % — ABNORMAL LOW (ref 39.0–52.0)
Hemoglobin: 7.5 g/dL — ABNORMAL LOW (ref 13.0–17.0)
MCHC: 34.7 g/dL (ref 30.0–36.0)
MCV: 81.5 fL (ref 78.0–100.0)
RDW: 19.3 % — ABNORMAL HIGH (ref 11.5–15.5)

## 2011-04-09 LAB — PHOSPHORUS: Phosphorus: 5.9 mg/dL — ABNORMAL HIGH (ref 2.3–4.6)

## 2011-04-09 LAB — MAGNESIUM: Magnesium: 2.5 mg/dL (ref 1.5–2.5)

## 2011-04-09 LAB — URINE MICROSCOPIC-ADD ON

## 2011-04-09 NOTE — Consult Note (Signed)
  NAMEOWEN, Russell Lam                 ACCOUNT NO.:  [id number]  MEDICAL RECORD NO.:  [id number]  LOCATION:  2315                         FACILITY:  [facility]  PHYSICIAN:  Cecille Aver, M.D.DATE OF BIRTH:  05-14-1991  DATE OF CONSULTATION:  03/29/2011 DATE OF DISCHARGE:                                CONSULTATION   ADDENDUM:  PHYSICAL EXAMINATION:  GENERAL:  The patient is sedated on ventilation. RESPIRATORY:  On high frequency oscillator ventilation.  No wheezing, focal rhonchi on the left side. CARDIOVASCULAR:  S1-S2 normal. GI:  Abdomen nondistended. EXTREMITIES:  No edema but bilateral upper extremity emphysema along with chest and facial emphysema.    ______________________________ Lyn Hollingshead, MD   ______________________________ Cecille Aver, M.D.    RP/MEDQ  D:  03/29/2011  T:  03/29/2011  Job:  [id number]  Electronically Signed by Lyn Hollingshead MD on 03/30/2011 03:15:04 PM Electronically Signed by Annie Sable M.D. on 04/09/2011 07:54:12 PM
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
## 2011-04-09 NOTE — Op Note (Signed)
  Russell Lam, Russell Lam                 ACCOUNT NO.:  [id number]  MEDICAL RECORD NO.:  [id number]  LOCATION:  2315                         FACILITY:  [facility]  PHYSICIAN:  Ollen Gross. Vernell Morgans, M.D. DATE OF BIRTH:  [birth year]  DATE OF PROCEDURE:  03/26/2011 DATE OF DISCHARGE:                              OPERATIVE REPORT   PREOPERATIVE DIAGNOSIS:  Possible right hemathorax.  POSTOPERATIVE DIAGNOSIS:  Possible right hemathorax.  PROCEDURE:  Placement of right chest tube.  SURGEON:  Ollen Gross. Vernell Morgans, MD  ASSISTANT:  Lazaro Arms, PA  ANESTHESIA:  Local.  After consent was obtained from the family, the patient's right chest was prepped with ChloraPrep, allowed to dry and draped in usual sterile manner.  An area along the mid-axillary line at about the level of the nipple was infiltrated with 1% lidocaine.  Small incision was made with 11-blade knife.  A hemostat was used to tunnel up one rib space from the incision.  We then used the hemostat to bluntly dissect over that rib through the intercostal muscle into the pleural space.  Once we were able to open this tract bluntly with the hemostat and a finger, we placed a 40-French chest tube into this space.  The tube was anchored to the skin with a 3-0 silk stitch and placed a 20-cm of atrium suction. The patient tolerated the procedure well.  Sterile dressings were applied.  Chest x-ray confirmed the placement of the tube.     Ollen Gross. Vernell Morgans, M.D.     PST/MEDQ  D:  03/26/2011  T:  03/27/2011  Job:  [id number]  Electronically Signed by Chevis Pretty III M.D. on 04/09/2011 07:59:26 AM

## 2011-04-09 NOTE — Op Note (Signed)
  NAMEGARETT, Russell Lam                 ACCOUNT NO.:  [id number]  MEDICAL RECORD NO.:  [id number]  LOCATION:  2315                         FACILITY:  [facility]  PHYSICIAN:  Ollen Gross. Vernell Morgans, M.D. DATE OF BIRTH:  11-01-1990  DATE OF PROCEDURE:  03/26/2011 DATE OF DISCHARGE:                              OPERATIVE REPORT   PREPROCEDURE DIAGNOSIS:  Left pneumothorax.  POSTPROCEDURE DIAGNOSIS:  Left pneumothorax.  PROCEDURE:  Placement of left-sided chest tube.  SURGEON:  Ollen Gross. Vernell Morgans, MD.  ANESTHESIA:  Local.  PROCEDURE IN DETAIL:  After consent was obtained from the family, the patient's left chest was prepped with ChloraPrep, allowed to dry and draped in usual sterile manner.  The mid axillary line at the level of the nipple was infiltrated with 1% lidocaine.  A small incision was made with a 11-blade knife.  A hemostat was used to dissect up one rib space from this incision.  The hemostat was used to dissect over that rib and through the intercostal muscles into the thoracic cavity, the track was opened more with finger dissection.  A 32-French chest tube was then placed through the ribs into the thoracic cavity without difficulty. The tube was anchored to the skin with 0 silk stitch and the tube was placed top 20 cm of atrium suction.  Sterile dressings were applied. The patient tolerated procedure well.     Ollen Gross. Vernell Morgans, M.D.     PST/MEDQ  D:  03/26/2011  T:  03/27/2011  Job:  [id number]  Electronically Signed by Chevis Pretty III M.D. on 04/09/2011 07:59:30 AM

## 2011-04-10 ENCOUNTER — Inpatient Hospital Stay (HOSPITAL_COMMUNITY): Payer: No Typology Code available for payment source

## 2011-04-10 DIAGNOSIS — N186 End stage renal disease: Secondary | ICD-10-CM

## 2011-04-10 DIAGNOSIS — S27329A Contusion of lung, unspecified, initial encounter: Secondary | ICD-10-CM

## 2011-04-10 DIAGNOSIS — J9600 Acute respiratory failure, unspecified whether with hypoxia or hypercapnia: Secondary | ICD-10-CM

## 2011-04-10 DIAGNOSIS — G9340 Encephalopathy, unspecified: Secondary | ICD-10-CM

## 2011-04-10 DIAGNOSIS — S32009A Unspecified fracture of unspecified lumbar vertebra, initial encounter for closed fracture: Secondary | ICD-10-CM

## 2011-04-10 DIAGNOSIS — I120 Hypertensive chronic kidney disease with stage 5 chronic kidney disease or end stage renal disease: Secondary | ICD-10-CM

## 2011-04-10 LAB — CBC
HCT: 20.2 % — ABNORMAL LOW (ref 39.0–52.0)
Hemoglobin: 7.1 g/dL — ABNORMAL LOW (ref 13.0–17.0)
MCHC: 35.1 g/dL (ref 30.0–36.0)
MCV: 81.1 fL (ref 78.0–100.0)
RDW: 18.6 % — ABNORMAL HIGH (ref 11.5–15.5)

## 2011-04-10 LAB — BASIC METABOLIC PANEL
BUN: 134 mg/dL — ABNORMAL HIGH (ref 6–23)
Calcium: 8.4 mg/dL (ref 8.4–10.5)
Creatinine, Ser: 6.64 mg/dL — ABNORMAL HIGH (ref 0.50–1.35)
GFR calc Af Amer: 13 mL/min — ABNORMAL LOW (ref >60)
GFR calc non Af Amer: 11 mL/min — ABNORMAL LOW (ref >60)
Glucose, Bld: 115 mg/dL — ABNORMAL HIGH (ref 70–99)
Potassium: 4.3 mEq/L (ref 3.5–5.1)

## 2011-04-10 LAB — IRON AND TIBC
Iron: 57 ug/dL (ref 42–135)
Saturation Ratios: 28 % (ref 20–55)
UIBC: 144 ug/dL (ref 125–400)

## 2011-04-10 LAB — URINE CULTURE

## 2011-04-10 LAB — FERRITIN: Ferritin: 680 ng/mL — ABNORMAL HIGH (ref 22–322)

## 2011-04-10 LAB — GLUCOSE, CAPILLARY

## 2011-04-11 ENCOUNTER — Inpatient Hospital Stay (HOSPITAL_COMMUNITY): Payer: No Typology Code available for payment source

## 2011-04-11 LAB — CULTURE, RESPIRATORY W GRAM STAIN

## 2011-04-11 LAB — CBC
Hemoglobin: 7.3 g/dL — ABNORMAL LOW (ref 13.0–17.0)
MCH: 28.2 pg (ref 26.0–34.0)
MCHC: 34.9 g/dL (ref 30.0–36.0)
MCV: 80.7 fL (ref 78.0–100.0)
RBC: 2.59 MIL/uL — ABNORMAL LOW (ref 4.22–5.81)

## 2011-04-11 LAB — GLUCOSE, CAPILLARY
Glucose-Capillary: 106 mg/dL — ABNORMAL HIGH (ref 70–99)
Glucose-Capillary: 100 mg/dL — ABNORMAL HIGH (ref 70–99)
Glucose-Capillary: 97 mg/dL (ref 70–99)
Glucose-Capillary: 104 mg/dL — ABNORMAL HIGH (ref 70–99)

## 2011-04-11 LAB — RENAL FUNCTION PANEL
BUN: 52 mg/dL — ABNORMAL HIGH (ref 6–23)
CO2: 31 mEq/L (ref 19–32)
Calcium: 8.3 mg/dL — ABNORMAL LOW (ref 8.4–10.5)
Chloride: 99 mEq/L (ref 96–112)
Creatinine, Ser: 3.25 mg/dL — ABNORMAL HIGH (ref 0.50–1.35)

## 2011-04-11 NOTE — Op Note (Signed)
  Russell Lam, Russell Lam                 ACCOUNT NO.:  [id number]  MEDICAL RECORD NO.:  [id number]  LOCATION:  2315                         FACILITY:  [facility]  PHYSICIAN:  Janetta Hora. Fields, MD  DATE OF BIRTH:  [birth year]  DATE OF PROCEDURE:  04/10/2011 DATE OF DISCHARGE:                              OPERATIVE REPORT   PROCEDURE: 1. Ultrasound of neck. 2. Insertion of right internal jugular vein Diatek catheter.  PREOPERATIVE DIAGNOSIS:  Renal failure.  POSTOPERATIVE DIAGNOSIS:  Renal failure.  ANESTHESIA:  General.  ASSISTANT:  Nurse.  OPERATIVE FINDINGS:  A 23-cm Diatek catheter right internal jugular vein.  OPERATIVE DETAILS:  After obtaining informed consent from the patient's family, the patient was taken to the operating room.  The patient was placed in supine position on the operating table.  After administration of general anesthesia and placement of laryngeal mask, the patient's entire neck and chest were prepped and draped in the usual sterile fashion.  Ultrasound was used to identify the right internal jugular vein.  This had normal compressibility and respiratory variation.  Next, an introducer needle was used to cannulate the right internal jugular vein under ultrasound guidance.  A 0.035 J-tip guidewire was then threaded through the right internal jugular vein down in the inferior vena cava under fluoroscopic guidance.  Next, sequential 12, 14, and 16- [place] dilators with peel-away sheath placed over the guidewire in the right atrium.  A 23-cm Diatek catheter placed through the peel-away sheath down into the right atrium.  The peel-away sheath was then removed.  Catheter was then cut to length and hub attached.  Catheter was noted to flush and draw easily.  Catheter was inspected under fluoroscopy, found its tip to be in the right atrium.  No kinks throughout its course.  Catheter was sutured to skin with nylon sutures. The neck insertion site was closed with  Vicryl stitch.  The catheter was then loaded with concentrated heparin solution.  The patient tolerated the procedure well.  There were no complications.  Instrument, sponge, and needle counts were correct at the end of the case.  The patient was taken to recovery room in stable condition.     Janetta Hora. Fields, MD    CEF/MEDQ  D:  04/10/2011  T:  04/10/2011  Job:  [id number]  Electronically Signed by Fabienne Bruns MD on 04/11/2011 06:33:32 PM

## 2011-04-12 ENCOUNTER — Inpatient Hospital Stay (HOSPITAL_COMMUNITY): Payer: No Typology Code available for payment source

## 2011-04-12 LAB — CBC
HCT: 22.3 % — ABNORMAL LOW (ref 39.0–52.0)
Hemoglobin: 7.9 g/dL — ABNORMAL LOW (ref 13.0–17.0)
MCH: 29.2 pg (ref 26.0–34.0)
MCV: 82.3 fL (ref 78.0–100.0)
Platelets: 313 10*3/uL (ref 150–400)
RBC: 2.71 MIL/uL — ABNORMAL LOW (ref 4.22–5.81)
WBC: 18.8 10*3/uL — ABNORMAL HIGH (ref 4.0–10.5)

## 2011-04-12 LAB — BASIC METABOLIC PANEL
BUN: 92 mg/dL — ABNORMAL HIGH (ref 6–23)
CO2: 28 mEq/L (ref 19–32)
Chloride: 99 mEq/L (ref 96–112)
GFR calc Af Amer: 17 mL/min — ABNORMAL LOW (ref >60)
Potassium: 3.9 mEq/L (ref 3.5–5.1)

## 2011-04-12 LAB — GLUCOSE, CAPILLARY
Glucose-Capillary: 108 mg/dL — ABNORMAL HIGH (ref 70–99)
Glucose-Capillary: 98 mg/dL (ref 70–99)

## 2011-04-13 ENCOUNTER — Inpatient Hospital Stay (HOSPITAL_COMMUNITY): Payer: No Typology Code available for payment source

## 2011-04-13 LAB — CBC
MCH: 28.5 pg (ref 26.0–34.0)
MCV: 83.5 fL (ref 78.0–100.0)
Platelets: 425 10*3/uL — ABNORMAL HIGH (ref 150–400)
RDW: 18.2 % — ABNORMAL HIGH (ref 11.5–15.5)
WBC: 19.9 10*3/uL — ABNORMAL HIGH (ref 4.0–10.5)

## 2011-04-13 LAB — RENAL FUNCTION PANEL
Albumin: 2.3 g/dL — ABNORMAL LOW (ref 3.5–5.2)
Calcium: 9.1 mg/dL (ref 8.4–10.5)
Chloride: 95 mEq/L — ABNORMAL LOW (ref 96–112)
Creatinine, Ser: 6.57 mg/dL — ABNORMAL HIGH (ref 0.50–1.35)
GFR calc Af Amer: 13 mL/min — ABNORMAL LOW (ref >60)
GFR calc non Af Amer: 11 mL/min — ABNORMAL LOW (ref >60)
Sodium: 136 mEq/L (ref 135–145)

## 2011-04-14 ENCOUNTER — Inpatient Hospital Stay (HOSPITAL_COMMUNITY): Payer: No Typology Code available for payment source

## 2011-04-14 LAB — CBC
HCT: 24.7 % — ABNORMAL LOW (ref 39.0–52.0)
Hemoglobin: 8.4 g/dL — ABNORMAL LOW (ref 13.0–17.0)
WBC: 21 10*3/uL — ABNORMAL HIGH (ref 4.0–10.5)

## 2011-04-14 LAB — BASIC METABOLIC PANEL
BUN: 49 mg/dL — ABNORMAL HIGH (ref 6–23)
CO2: 29 mEq/L (ref 19–32)
Chloride: 99 mEq/L (ref 96–112)
Glucose, Bld: 102 mg/dL — ABNORMAL HIGH (ref 70–99)
Potassium: 3.7 mEq/L (ref 3.5–5.1)

## 2011-04-14 LAB — GLUCOSE, CAPILLARY
Glucose-Capillary: 92 mg/dL (ref 70–99)
Glucose-Capillary: 88 mg/dL (ref 70–99)

## 2011-04-15 LAB — CBC
HCT: 23.9 % — ABNORMAL LOW (ref 39.0–52.0)
MCH: 28.3 pg (ref 26.0–34.0)
MCHC: 32.6 g/dL (ref 30.0–36.0)
MCV: 86.6 fL (ref 78.0–100.0)
Platelets: 449 10*3/uL — ABNORMAL HIGH (ref 150–400)
RDW: 18.9 % — ABNORMAL HIGH (ref 11.5–15.5)
WBC: 19.7 10*3/uL — ABNORMAL HIGH (ref 4.0–10.5)

## 2011-04-15 LAB — COMPREHENSIVE METABOLIC PANEL
AST: 25 U/L (ref 0–37)
Albumin: 2.2 g/dL — ABNORMAL LOW (ref 3.5–5.2)
BUN: 58 mg/dL — ABNORMAL HIGH (ref 6–23)
Calcium: 8.9 mg/dL (ref 8.4–10.5)
Chloride: 103 mEq/L (ref 96–112)
Creatinine, Ser: 4.93 mg/dL — ABNORMAL HIGH (ref 0.50–1.35)
Total Bilirubin: 0.8 mg/dL (ref 0.3–1.2)
Total Protein: 7 g/dL (ref 6.0–8.3)

## 2011-04-15 LAB — CULTURE, BLOOD (ROUTINE X 2): Culture: NO GROWTH

## 2011-04-15 LAB — DIFFERENTIAL
Basophils Absolute: 0.2 10*3/uL — ABNORMAL HIGH (ref 0.0–0.1)
Basophils Relative: 1 % (ref 0–1)
Eosinophils Absolute: 0.6 10*3/uL (ref 0.0–0.7)
Lymphocytes Relative: 11 % — ABNORMAL LOW (ref 12–46)
Lymphs Abs: 2.2 10*3/uL (ref 0.7–4.0)
Monocytes Absolute: 2.2 10*3/uL — ABNORMAL HIGH (ref 0.1–1.0)
Neutro Abs: 14.5 10*3/uL — ABNORMAL HIGH (ref 1.7–7.7)

## 2011-04-15 LAB — GLUCOSE, CAPILLARY
Glucose-Capillary: 102 mg/dL — ABNORMAL HIGH (ref 70–99)
Glucose-Capillary: 90 mg/dL (ref 70–99)

## 2011-04-15 LAB — URINALYSIS, ROUTINE W REFLEX MICROSCOPIC
Glucose, UA: NEGATIVE mg/dL
Ketones, ur: NEGATIVE mg/dL
Nitrite: NEGATIVE
Protein, ur: 100 mg/dL — AB
pH: 8 (ref 5.0–8.0)

## 2011-04-15 LAB — URINE MICROSCOPIC-ADD ON

## 2011-04-15 LAB — EXPECTORATED SPUTUM ASSESSMENT W GRAM STAIN, RFLX TO RESP C

## 2011-04-16 ENCOUNTER — Inpatient Hospital Stay (HOSPITAL_COMMUNITY)
Admission: RE | Admit: 2011-04-16 | Discharge: 2011-04-24 | DRG: 945 | Disposition: A | Discharge status: Home / Self Care | Payer: Medicaid Other | Source: Other Acute Inpatient Hospital | Attending: Physical Medicine & Rehabilitation | Admitting: Physical Medicine & Rehabilitation

## 2011-04-16 DIAGNOSIS — S32009A Unspecified fracture of unspecified lumbar vertebra, initial encounter for closed fracture: Secondary | ICD-10-CM | POA: Diagnosis present

## 2011-04-16 DIAGNOSIS — N179 Acute kidney failure, unspecified: Secondary | ICD-10-CM | POA: Diagnosis present

## 2011-04-16 DIAGNOSIS — D62 Acute posthemorrhagic anemia: Secondary | ICD-10-CM | POA: Diagnosis present

## 2011-04-16 DIAGNOSIS — S069X9A Unspecified intracranial injury with loss of consciousness of unspecified duration, initial encounter: Secondary | ICD-10-CM

## 2011-04-16 DIAGNOSIS — K560 Paralytic ileus: Secondary | ICD-10-CM | POA: Diagnosis present

## 2011-04-16 DIAGNOSIS — R0602 Shortness of breath: Secondary | ICD-10-CM | POA: Diagnosis present

## 2011-04-16 DIAGNOSIS — G729 Myopathy, unspecified: Secondary | ICD-10-CM | POA: Diagnosis present

## 2011-04-16 DIAGNOSIS — S069X0A Unspecified intracranial injury without loss of consciousness, initial encounter: Secondary | ICD-10-CM | POA: Diagnosis present

## 2011-04-16 DIAGNOSIS — A0472 Enterocolitis due to Clostridium difficile, not specified as recurrent: Secondary | ICD-10-CM | POA: Diagnosis present

## 2011-04-16 DIAGNOSIS — Z5189 Encounter for other specified aftercare: Principal | ICD-10-CM

## 2011-04-16 DIAGNOSIS — S270XXA Traumatic pneumothorax, initial encounter: Secondary | ICD-10-CM | POA: Diagnosis present

## 2011-04-16 LAB — MAGNESIUM: Magnesium: 2.2 mg/dL (ref 1.5–2.5)

## 2011-04-16 LAB — RENAL FUNCTION PANEL
BUN: 59 mg/dL — ABNORMAL HIGH (ref 6–23)
BUN: 55 mg/dL — ABNORMAL HIGH (ref 6–23)
Calcium: 8.2 mg/dL — ABNORMAL LOW (ref 8.4–10.5)
Chloride: 104 mEq/L (ref 96–112)
Creatinine, Ser: 4.29 mg/dL — ABNORMAL HIGH (ref 0.50–1.35)
Creatinine, Ser: 4.09 mg/dL — ABNORMAL HIGH (ref 0.50–1.35)
Glucose, Bld: 107 mg/dL — ABNORMAL HIGH (ref 70–99)
Glucose, Bld: 83 mg/dL (ref 70–99)
Phosphorus: 5.8 mg/dL — ABNORMAL HIGH (ref 2.3–4.6)
Potassium: 3.9 mEq/L (ref 3.5–5.1)

## 2011-04-16 LAB — CBC
HCT: 21.8 % — ABNORMAL LOW (ref 39.0–52.0)
Hemoglobin: 7.3 g/dL — ABNORMAL LOW (ref 13.0–17.0)
MCHC: 33.5 g/dL (ref 30.0–36.0)
MCV: 86.5 fL (ref 78.0–100.0)
WBC: 19.1 10*3/uL — ABNORMAL HIGH (ref 4.0–10.5)

## 2011-04-16 LAB — GLUCOSE, CAPILLARY

## 2011-04-17 DIAGNOSIS — S32009A Unspecified fracture of unspecified lumbar vertebra, initial encounter for closed fracture: Secondary | ICD-10-CM

## 2011-04-17 DIAGNOSIS — S069X9A Unspecified intracranial injury with loss of consciousness of unspecified duration, initial encounter: Secondary | ICD-10-CM

## 2011-04-17 LAB — DIFFERENTIAL
Basophils Relative: 0 % (ref 0–1)
Eosinophils Relative: 4 % (ref 0–5)
Lymphs Abs: 2.4 10*3/uL (ref 0.7–4.0)
Monocytes Absolute: 1.1 10*3/uL — ABNORMAL HIGH (ref 0.1–1.0)
Neutro Abs: 11.2 10*3/uL — ABNORMAL HIGH (ref 1.7–7.7)
Neutrophils Relative %: 73 % (ref 43–77)

## 2011-04-17 LAB — CBC
HCT: 21.9 % — ABNORMAL LOW (ref 39.0–52.0)
MCV: 85.9 fL (ref 78.0–100.0)
RDW: 19.2 % — ABNORMAL HIGH (ref 11.5–15.5)
WBC: 15.3 10*3/uL — ABNORMAL HIGH (ref 4.0–10.5)

## 2011-04-17 LAB — COMPREHENSIVE METABOLIC PANEL
Albumin: 2.1 g/dL — ABNORMAL LOW (ref 3.5–5.2)
BUN: 50 mg/dL — ABNORMAL HIGH (ref 6–23)
Chloride: 104 mEq/L (ref 96–112)
Creatinine, Ser: 3.73 mg/dL — ABNORMAL HIGH (ref 0.50–1.35)
GFR calc non Af Amer: 21 mL/min — ABNORMAL LOW (ref >60)
Total Bilirubin: 0.7 mg/dL (ref 0.3–1.2)

## 2011-04-18 ENCOUNTER — Inpatient Hospital Stay (HOSPITAL_COMMUNITY): Payer: Medicaid Other

## 2011-04-18 LAB — CBC
MCV: 86.3 fL (ref 78.0–100.0)
Platelets: 411 10*3/uL — ABNORMAL HIGH (ref 150–400)
RBC: 2.55 MIL/uL — ABNORMAL LOW (ref 4.22–5.81)
WBC: 13.3 10*3/uL — ABNORMAL HIGH (ref 4.0–10.5)

## 2011-04-18 LAB — CULTURE, RESPIRATORY W GRAM STAIN

## 2011-04-18 LAB — RENAL FUNCTION PANEL
CO2: 22 mEq/L (ref 19–32)
Chloride: 104 mEq/L (ref 96–112)
GFR calc Af Amer: 32 mL/min — ABNORMAL LOW (ref >60)
GFR calc non Af Amer: 26 mL/min — ABNORMAL LOW (ref >60)
Sodium: 136 mEq/L (ref 135–145)

## 2011-04-18 LAB — MAGNESIUM: Magnesium: 2.1 mg/dL (ref 1.5–2.5)

## 2011-04-19 DIAGNOSIS — S32009A Unspecified fracture of unspecified lumbar vertebra, initial encounter for closed fracture: Secondary | ICD-10-CM

## 2011-04-19 DIAGNOSIS — S069X9A Unspecified intracranial injury with loss of consciousness of unspecified duration, initial encounter: Secondary | ICD-10-CM

## 2011-04-19 LAB — BASIC METABOLIC PANEL
GFR calc Af Amer: 42 mL/min — ABNORMAL LOW (ref >60)
GFR calc non Af Amer: 35 mL/min — ABNORMAL LOW (ref >60)
Potassium: 4.4 mEq/L (ref 3.5–5.1)
Sodium: 136 mEq/L (ref 135–145)

## 2011-04-20 LAB — CULTURE, BLOOD (ROUTINE X 2)
Culture  Setup Time: 201209021741
Culture: NO GROWTH

## 2011-04-20 LAB — RENAL FUNCTION PANEL
BUN: 23 mg/dL (ref 6–23)
CO2: 23 mEq/L (ref 19–32)
Calcium: 9.3 mg/dL (ref 8.4–10.5)
Creatinine, Ser: 2.11 mg/dL — ABNORMAL HIGH (ref 0.50–1.35)
GFR calc non Af Amer: 40 mL/min — ABNORMAL LOW (ref >60)
Glucose, Bld: 90 mg/dL (ref 70–99)

## 2011-04-20 LAB — MAGNESIUM: Magnesium: 1.9 mg/dL (ref 1.5–2.5)

## 2011-04-22 DIAGNOSIS — S32009A Unspecified fracture of unspecified lumbar vertebra, initial encounter for closed fracture: Secondary | ICD-10-CM

## 2011-04-22 DIAGNOSIS — S069X9A Unspecified intracranial injury with loss of consciousness of unspecified duration, initial encounter: Secondary | ICD-10-CM

## 2011-04-23 DIAGNOSIS — S069X9A Unspecified intracranial injury with loss of consciousness of unspecified duration, initial encounter: Secondary | ICD-10-CM

## 2011-04-23 DIAGNOSIS — S32009A Unspecified fracture of unspecified lumbar vertebra, initial encounter for closed fracture: Secondary | ICD-10-CM

## 2011-04-23 LAB — BASIC METABOLIC PANEL
CO2: 24 mEq/L (ref 19–32)
Calcium: 9.3 mg/dL (ref 8.4–10.5)
Creatinine, Ser: 1.36 mg/dL — ABNORMAL HIGH (ref 0.50–1.35)
Glucose, Bld: 92 mg/dL (ref 70–99)

## 2011-04-23 LAB — CBC
HCT: 27.1 % — ABNORMAL LOW (ref 39.0–52.0)
Hemoglobin: 8.8 g/dL — ABNORMAL LOW (ref 13.0–17.0)
MCH: 28.1 pg (ref 26.0–34.0)
MCV: 86.6 fL (ref 78.0–100.0)
RBC: 3.13 MIL/uL — ABNORMAL LOW (ref 4.22–5.81)

## 2011-04-24 DIAGNOSIS — S069X9A Unspecified intracranial injury with loss of consciousness of unspecified duration, initial encounter: Secondary | ICD-10-CM

## 2011-04-24 DIAGNOSIS — S32009A Unspecified fracture of unspecified lumbar vertebra, initial encounter for closed fracture: Secondary | ICD-10-CM
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
## 2011-04-29 ENCOUNTER — Emergency Department (HOSPITAL_COMMUNITY)
Admission: EM | Admit: 2011-04-29 | Discharge: 2011-04-29 | Disposition: A | Discharge status: Home / Self Care | Payer: Medicaid Other | Attending: Emergency Medicine | Admitting: Emergency Medicine

## 2011-04-29 DIAGNOSIS — R300 Dysuria: Secondary | ICD-10-CM | POA: Insufficient documentation

## 2011-04-29 DIAGNOSIS — J45909 Unspecified asthma, uncomplicated: Secondary | ICD-10-CM | POA: Insufficient documentation

## 2011-04-29 DIAGNOSIS — N390 Urinary tract infection, site not specified: Secondary | ICD-10-CM | POA: Insufficient documentation

## 2011-04-29 LAB — URINALYSIS, ROUTINE W REFLEX MICROSCOPIC
Glucose, UA: NEGATIVE mg/dL
Hgb urine dipstick: NEGATIVE
Ketones, ur: NEGATIVE mg/dL
Protein, ur: NEGATIVE mg/dL
pH: 7.5 (ref 5.0–8.0)

## 2011-04-29 LAB — URINE MICROSCOPIC-ADD ON

## 2011-04-30 LAB — URINE CULTURE
Colony Count: NO GROWTH
Culture: NO GROWTH
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
## 2011-05-21 ENCOUNTER — Encounter: Payer: Self-pay | Admitting: Physical Medicine & Rehabilitation

## 2011-06-17 ENCOUNTER — Encounter
Payer: No Typology Code available for payment source | Attending: Physical Medicine & Rehabilitation | Admitting: Physical Medicine & Rehabilitation

## 2011-06-17 DIAGNOSIS — M549 Dorsalgia, unspecified: Secondary | ICD-10-CM | POA: Insufficient documentation

## 2011-06-17 DIAGNOSIS — S270XXA Traumatic pneumothorax, initial encounter: Secondary | ICD-10-CM

## 2011-06-17 DIAGNOSIS — X58XXXA Exposure to other specified factors, initial encounter: Secondary | ICD-10-CM | POA: Insufficient documentation

## 2011-06-17 DIAGNOSIS — S32009A Unspecified fracture of unspecified lumbar vertebra, initial encounter for closed fracture: Secondary | ICD-10-CM

## 2011-06-17 DIAGNOSIS — S069X9A Unspecified intracranial injury with loss of consciousness of unspecified duration, initial encounter: Secondary | ICD-10-CM | POA: Insufficient documentation

## 2011-06-17 DIAGNOSIS — R0602 Shortness of breath: Secondary | ICD-10-CM | POA: Insufficient documentation

## 2011-06-17 DIAGNOSIS — R0789 Other chest pain: Secondary | ICD-10-CM | POA: Insufficient documentation

## 2011-06-17 DIAGNOSIS — S069XAA Unspecified intracranial injury with loss of consciousness status unknown, initial encounter: Secondary | ICD-10-CM | POA: Insufficient documentation

## 2011-06-17 DIAGNOSIS — J9383 Other pneumothorax: Secondary | ICD-10-CM | POA: Insufficient documentation
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
## 2011-08-20 ENCOUNTER — Encounter: Payer: Self-pay | Attending: Physical Medicine & Rehabilitation | Admitting: Physical Medicine & Rehabilitation

## 2013-09-14 ENCOUNTER — Encounter (HOSPITAL_COMMUNITY): Payer: Self-pay | Admitting: Emergency Medicine

## 2013-09-14 ENCOUNTER — Emergency Department (INDEPENDENT_AMBULATORY_CARE_PROVIDER_SITE_OTHER)
Admission: EM | Admit: 2013-09-14 | Discharge: 2013-09-14 | Disposition: A | Discharge status: Home / Self Care | Payer: Self-pay | Source: Home / Self Care

## 2013-09-14 DIAGNOSIS — B86 Scabies: Secondary | ICD-10-CM

## 2013-09-14 MED ORDER — PERMETHRIN 5 % EX CREA: TOPICAL_CREAM | CUTANEOUS | Status: DC

## 2013-09-14 NOTE — ED Provider Notes (Signed)
CSN: [id number]     Arrival date & time 09/14/13  0927 History   None    Chief Complaint  Patient presents with  . Rash   (Consider location/radiation/quality/duration/timing/severity/associated sxs/prior Treatment) Patient is a 23 y.o. male presenting with rash. The history is provided by the patient.  Rash Location:  Leg and shoulder/arm Shoulder/arm rash location:  L wrist, R wrist, L hand and R hand Leg rash location:  L upper leg and R upper leg Quality: itchiness   Severity:  Moderate Onset quality:  Gradual Duration:  1 week Progression:  Spreading Chronicity:  New Associated symptoms: no abdominal pain and no myalgias     History reviewed. No pertinent past medical history. History reviewed. No pertinent past surgical history. History reviewed. No pertinent family history. History  Substance Use Topics  . Smoking status: Never Smoker   . Smokeless tobacco: Not on file  . Alcohol Use: No    Review of Systems  Constitutional: Negative.   Gastrointestinal: Negative for abdominal pain.  Musculoskeletal: Negative for myalgias.  Skin: Positive for rash.    Allergies  Review of patient's allergies indicates no known allergies.  Home Medications   Current Outpatient Rx  Name  Route  Sig  Dispense  Refill  . permethrin (ELIMITE) 5 % cream      Use as directed on bottle, repeat in 1 wk.   60 g   1    BP 116/63  Pulse 72  Temp(Src) 98.4 F (36.9 C) (Oral)  Resp 18  SpO2 98% Physical Exam  Nursing note and vitals reviewed. Constitutional: He is oriented to person, place, and time. He appears well-developed and well-nourished.  Neurological: He is alert and oriented to person, place, and time.  Skin: Skin is warm and dry. Rash noted.  Excoriated rash on arms , legs, papular    ED Course  Procedures (including critical care time) Labs Review Labs Reviewed - No data to display Imaging Review No results found.    MDM      Linna HoffJames D Joeleen Wortley,  MD 09/22/13 2227

## 2013-09-14 NOTE — ED Notes (Addendum)
C/o rash on arms, legs, and hands that started 1 week ago. Stated that the bumps burn and itch to the point that they bleed. Denies any change in soap or laundry detergent that may have caused bumps. Pain is 7/10. Denies any other discharge from bumps. Stated he hasn't tried any medications for relief. Written by: Marga MelnickQuaNeisha Jones, SMA

## 2013-09-29 ENCOUNTER — Telehealth (HOSPITAL_COMMUNITY): Payer: Self-pay | Admitting: *Deleted

## 2013-09-29 NOTE — ED Notes (Signed)
Pt. called and said he never filled Rx. and then lost it.  He does not know what the name of it. States it was a cream for a rash. I told him I would call back because my computer was locked and I could not access his chart. Chart accessed and Rx. was Permethrin 5% for scabies. Discussed with Dr. Artis FlockKindl and he approved for me to call in Rx.  I called pt. back and told him what it was, what it was for and reviewed how to use it.  [facility] [address]  Rx. called to pharmacist @ [phone]. Vassie MoselleYork, Russell Lam 09/29/2013

## 2014-04-04 ENCOUNTER — Emergency Department (HOSPITAL_COMMUNITY)
Admission: EM | Admit: 2014-04-04 | Discharge: 2014-04-04 | Disposition: A | Discharge status: Home / Self Care | Payer: No Typology Code available for payment source | Attending: Emergency Medicine | Admitting: Emergency Medicine

## 2014-04-04 ENCOUNTER — Encounter (HOSPITAL_COMMUNITY): Payer: Self-pay | Admitting: Emergency Medicine

## 2014-04-04 DIAGNOSIS — Y9389 Activity, other specified: Secondary | ICD-10-CM | POA: Insufficient documentation

## 2014-04-04 DIAGNOSIS — Y92410 Unspecified street and highway as the place of occurrence of the external cause: Secondary | ICD-10-CM | POA: Insufficient documentation

## 2014-04-04 DIAGNOSIS — S335XXA Sprain of ligaments of lumbar spine, initial encounter: Secondary | ICD-10-CM | POA: Insufficient documentation

## 2014-04-04 DIAGNOSIS — IMO0002 Reserved for concepts with insufficient information to code with codable children: Secondary | ICD-10-CM | POA: Insufficient documentation

## 2014-04-04 DIAGNOSIS — S39012A Strain of muscle, fascia and tendon of lower back, initial encounter: Secondary | ICD-10-CM

## 2014-04-04 MED ORDER — METHOCARBAMOL 500 MG PO TABS: 500.0000 mg | ORAL_TABLET | Freq: Three times a day (TID) | ORAL | Status: DC

## 2014-04-04 MED ORDER — MELOXICAM 15 MG PO TABS: 15.0000 mg | ORAL_TABLET | Freq: Every day | ORAL | Status: DC
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
## 2014-04-04 NOTE — Discharge Instructions (Signed)
Do exercises twice daily followed by moist heat for 15 minutes. ° ° ° ° ° °Try to be as active as possible. ° °If no better in 2 weeks, follow up with orthopedist. ° ° °

## 2017-09-15 ENCOUNTER — Other Ambulatory Visit: Payer: Self-pay

## 2017-09-15 DIAGNOSIS — E876 Hypokalemia: Secondary | ICD-10-CM | POA: Insufficient documentation

## 2017-09-15 DIAGNOSIS — K529 Noninfective gastroenteritis and colitis, unspecified: Secondary | ICD-10-CM | POA: Diagnosis not present

## 2017-09-15 DIAGNOSIS — R112 Nausea with vomiting, unspecified: Secondary | ICD-10-CM | POA: Diagnosis present

## 2017-09-15 NOTE — ED Triage Notes (Addendum)
Pt presents to ED with epigastric abd pain described as twisting pain, fever, n/v/d, and congestion. Onset this morning. Pt had a witnessed syncopal episode around 2145; landed on the bed. Denies injury. Pt alert at this time and able to answer questions without difficulty. No increased work of breathing noted at this time.

## 2017-09-16 ENCOUNTER — Emergency Department
Admission: EM | Admit: 2017-09-16 | Discharge: 2017-09-16 | Disposition: A | Discharge status: Home / Self Care | Payer: BLUE CROSS/BLUE SHIELD | Attending: Emergency Medicine | Admitting: Emergency Medicine

## 2017-09-16 ENCOUNTER — Encounter: Payer: Self-pay | Admitting: Emergency Medicine

## 2017-09-16 DIAGNOSIS — K529 Noninfective gastroenteritis and colitis, unspecified: Secondary | ICD-10-CM

## 2017-09-16 DIAGNOSIS — E876 Hypokalemia: Secondary | ICD-10-CM

## 2017-09-16 LAB — URINALYSIS, COMPLETE (UACMP) WITH MICROSCOPIC
Bacteria, UA: NONE SEEN
Bilirubin Urine: NEGATIVE
GLUCOSE, UA: NEGATIVE mg/dL
KETONES UR: NEGATIVE mg/dL
Leukocytes, UA: NEGATIVE
Nitrite: NEGATIVE
PROTEIN: 30 mg/dL — AB
SQUAMOUS EPITHELIAL / LPF: NONE SEEN
Specific Gravity, Urine: 1.034 — ABNORMAL HIGH (ref 1.005–1.030)
pH: 5 (ref 5.0–8.0)

## 2017-09-16 LAB — CBC
HEMATOCRIT: 42.8 % (ref 40.0–52.0)
HEMOGLOBIN: 14.2 g/dL (ref 13.0–18.0)
MCH: 28.2 pg (ref 26.0–34.0)
MCHC: 33.2 g/dL (ref 32.0–36.0)
MCV: 84.9 fL (ref 80.0–100.0)
Platelets: 311 10*3/uL (ref 150–440)
RBC: 5.04 MIL/uL (ref 4.40–5.90)
RDW: 13.8 % (ref 11.5–14.5)
WBC: 7.4 10*3/uL (ref 3.8–10.6)

## 2017-09-16 LAB — COMPREHENSIVE METABOLIC PANEL
ALT: 24 U/L (ref 17–63)
ANION GAP: 12 (ref 5–15)
AST: 46 U/L — ABNORMAL HIGH (ref 15–41)
Albumin: 4.3 g/dL (ref 3.5–5.0)
Alkaline Phosphatase: 81 U/L (ref 38–126)
BUN: 17 mg/dL (ref 6–20)
CO2: 17 mmol/L — AB (ref 22–32)
Calcium: 9.2 mg/dL (ref 8.9–10.3)
Chloride: 105 mmol/L (ref 101–111)
Creatinine, Ser: 1.09 mg/dL (ref 0.61–1.24)
GFR calc non Af Amer: >60 mL/min (ref >60)
Glucose, Bld: 112 mg/dL — ABNORMAL HIGH (ref 65–99)
Potassium: 3.2 mmol/L — ABNORMAL LOW (ref 3.5–5.1)
SODIUM: 134 mmol/L — AB (ref 135–145)
Total Bilirubin: 0.9 mg/dL (ref 0.3–1.2)
Total Protein: 7.9 g/dL (ref 6.5–8.1)

## 2017-09-16 LAB — LIPASE, BLOOD: Lipase: 21 U/L (ref 11–51)

## 2017-09-16 MED ORDER — ONDANSETRON HCL 4 MG/2ML IJ SOLN
4.0000 mg | INTRAMUSCULAR | Status: AC
  Administered 2017-09-16: 4 mg via INTRAVENOUS
  Filled 2017-09-16: qty 2

## 2017-09-16 MED ORDER — POTASSIUM CHLORIDE CRYS ER 20 MEQ PO TBCR
40.0000 meq | EXTENDED_RELEASE_TABLET | Freq: Once | ORAL | Status: AC
  Administered 2017-09-16: 40 meq via ORAL
  Filled 2017-09-16: qty 2

## 2017-09-16 MED ORDER — SODIUM CHLORIDE 0.9 % IV BOLUS (SEPSIS)
1000.0000 mL | INTRAVENOUS | Status: AC
  Administered 2017-09-16: 1000 mL via INTRAVENOUS

## 2017-09-16 MED ORDER — ONDANSETRON 4 MG PO TBDP: ORAL_TABLET | ORAL | 0 refills | Status: DC

## 2017-09-16 MED ORDER — POTASSIUM CHLORIDE CRYS ER 20 MEQ PO TBCR: 20.0000 meq | EXTENDED_RELEASE_TABLET | Freq: Every day | ORAL | 0 refills | Status: AC

## 2017-09-16 NOTE — ED Provider Notes (Signed)
[facility] Emergency Department Provider Note  ____________________________________________   First MD Initiated Contact with Patient 09/16/17 0245     (approximate)  I have reviewed the triage vital signs and the nursing notes.   HISTORY  Chief Complaint Emesis; Diarrhea; Nasal Congestion; and Fever    HPI Russell Lam is a 27 y.o. male who is generally healthy with no chronic medical issues who presents for evaluation of acute onset nausea, vomiting, and diarrhea.  His girlfriend (male companion) reports that she and the rest of her family were sick with the same symptoms the day before, but they at all even at a get together and assumed it was food poisoning.  However his symptoms did not start until today.  He has had numerous episodes of vomiting and loose stools today.  When he vomits he has pain in his abdomen and sometimes he feels some cramping pain, but it is not constant.  He denies fever/chills, chest pain, shortness of breath.  He has had some nasal congestion recently.  No difficulty swallowing, nor sore throat, no visual changes.  He has felt quite lightheaded and reportedly had a syncopal or near syncopal episode earlier today.  He works third shift and was able to work overnight the previous night but has not been able to get any rest today and is not been able to eat or drink anything.  No pain when he urinates.  His symptoms are severe.  History reviewed. No pertinent past medical history.  There are no active problems to display for this patient.   Past Surgical History:  Procedure Laterality Date  . BACK SURGERY      Prior to Admission medications   Medication Sig Start Date End Date Taking? Authorizing Provider  meloxicam (MOBIC) 15 MG tablet Take 1 tablet (15 mg total) by mouth daily. 04/04/14   Reuben LikesKeller, David C, MD  methocarbamol (ROBAXIN) 500 MG tablet Take 1 tablet (500 mg total) by mouth 3 (three) times daily. 04/04/14   Reuben LikesKeller,  David C, MD  ondansetron (ZOFRAN ODT) 4 MG disintegrating tablet Allow 1-2 tablets to dissolve in your mouth every 8 hours as needed for nausea/vomiting 09/16/17   Loleta RoseForbach, Caelan Branden, MD  permethrin (ELIMITE) 5 % cream Use as directed on bottle, repeat in 1 wk. 09/14/13   Linna HoffKindl, James D, MD  potassium chloride SA (KLOR-CON M20) 20 MEQ tablet Take 1 tablet (20 mEq total) by mouth daily. 09/16/17   Loleta RoseForbach, Mikahla Wisor, MD    Allergies Patient has no known allergies.  No family history on file.  Social History Social History   Tobacco Use  . Smoking status: Never Smoker  . Smokeless tobacco: Never Used  Substance Use Topics  . Alcohol use: Yes  . Drug use: No    Review of Systems Constitutional: No fever/chills Eyes: No visual changes. ENT: No sore throat. Cardiovascular: Denies chest pain.  Syncopal or near-syncopal episode. Respiratory: Denies shortness of breath. Gastrointestinal:  Severe N/V/D with intermittent abdominal cramping.   Genitourinary: Negative for dysuria. Musculoskeletal: Negative for neck pain.  Negative for back pain. Integumentary: Negative for rash. Neurological: Negative for headaches, focal weakness or numbness.   ____________________________________________   PHYSICAL EXAM:  VITAL SIGNS: ED Triage Vitals  Enc Vitals Group     BP 09/15/17 2348 126/73     Pulse Rate 09/15/17 2348 (!) 119     Resp 09/15/17 2348 20     Temp 09/15/17 2348 99.1 F (37.3 C)  Temp Source 09/15/17 2348 Oral     SpO2 09/15/17 2348 99 %     Weight 09/15/17 2348 111.1 kg (245 lb)     Height 09/15/17 2348 1.905 m (6\' 3" )     Head Circumference --      Peak Flow --      Pain Score 09/15/17 2358 9     Pain Loc --      Pain Edu? --      Excl. in GC? --     Constitutional: Alert and oriented. Well appearing and in no acute distress, though he does appear somewhat uncomfortable Eyes: Conjunctivae are normal.  Head: Atraumatic. Nose: +congestion Mouth/Throat: Mucous membranes are  moist. Neck: No stridor.  No meningeal signs.   Cardiovascular: Tachycardia,  regular rhythm. Good peripheral circulation. Grossly normal heart sounds. Respiratory: Normal respiratory effort.  No retractions. Lungs CTAB. Gastrointestinal: Soft and nontender. No distention.  Musculoskeletal: No lower extremity tenderness nor edema. No gross deformities of extremities. Neurologic:  Normal speech and language. No gross focal neurologic deficits are appreciated.  Skin:  Skin is warm, dry and intact. No rash noted. Psychiatric: Mood and affect are normal. Speech and behavior are normal.  ____________________________________________   LABS (all labs ordered are listed, but only abnormal results are displayed)  Labs Reviewed  COMPREHENSIVE METABOLIC PANEL - Abnormal; Notable for the following components:      Result Value   Sodium 134 (*)    Potassium 3.2 (*)    CO2 17 (*)    Glucose, Bld 112 (*)    AST 46 (*)    All other components within normal limits  URINALYSIS, COMPLETE (UACMP) WITH MICROSCOPIC - Abnormal; Notable for the following components:   Color, Urine AMBER (*)    APPearance HAZY (*)    Specific Gravity, Urine 1.034 (*)    Hgb urine dipstick SMALL (*)    Protein, ur 30 (*)    All other components within normal limits  LIPASE, BLOOD  CBC   ____________________________________________  EKG  ED ECG REPORT I, Loleta Rose, the attending physician, personally viewed and interpreted this ECG.  Date: 09/16/2017 EKG Time: 00: 00 Rate: 112 Rhythm: Sinus tachycardia QRS Axis: normal Intervals: normal ST/T Wave abnormalities: normal Narrative Interpretation: no evidence of acute ischemia  ____________________________________________  RADIOLOGY   ED MD interpretation: Imaging  Official radiology report(s): No results found.  ____________________________________________   PROCEDURES  Critical Care performed: No   Procedure(s) performed:    Procedures   ____________________________________________   INITIAL IMPRESSION / ASSESSMENT AND PLAN / ED COURSE  As part of my medical decision making, I reviewed the following data within the electronic MEDICAL RECORD NUMBER Nursing notes reviewed and incorporated and Labs reviewed     Differential diagnosis includes, but is not limited to, viral gastroenteritis, bacterial colitis or enteritis, viral syndrome, SBO/ileus, appendicitis, biliary colic.  The patient has no tenderness to palpation on exam and given the history as well as the prevalence of viral gastroenteritis in the community right now, I believe that is the source of his symptoms.  His lab results are notable only for mild hypokalemia likely as a result of his GI losses.  I will provide a liter of IV fluids and Zofran and then check a p.o. challenge.  If he is able to tolerate oral intake I will replete his potassium and anticipate symptomatic treatment and outpatient follow-up.  The patient agrees with the plan.  No indication for any stool studies or  imaging.   Clinical Course as of Sep 17 455  Tue Sep 16, 2017  0258 Potassium: (!) 3.2 [CF]    Clinical Course User Index [CF] Loleta Rose, MD    ____________________________________________  FINAL CLINICAL IMPRESSION(S) / ED DIAGNOSES  Final diagnoses:  Gastroenteritis  Hypokalemia, gastrointestinal losses     MEDICATIONS GIVEN DURING THIS VISIT:  Medications  potassium chloride SA (K-DUR,KLOR-CON) CR tablet 40 mEq (not administered)  ondansetron (ZOFRAN) injection 4 mg (4 mg Intravenous Given 09/16/17 0316)  sodium chloride 0.9 % bolus 1,000 mL (0 mLs Intravenous Stopped 09/16/17 0428)     ED Discharge Orders        Ordered    potassium chloride SA (KLOR-CON M20) 20 MEQ tablet  Daily     09/16/17 0454    ondansetron (ZOFRAN ODT) 4 MG disintegrating tablet     09/16/17 0454       Note:  This document was prepared using Dragon voice recognition software  and may include unintentional dictation errors.    Loleta Rose, MD 09/16/17 [phone]

## 2017-09-16 NOTE — Discharge Instructions (Signed)

## 2018-04-30 ENCOUNTER — Other Ambulatory Visit: Payer: Self-pay

## 2018-04-30 ENCOUNTER — Ambulatory Visit (HOSPITAL_COMMUNITY)
Admission: EM | Admit: 2018-04-30 | Discharge: 2018-04-30 | Disposition: A | Discharge status: Home / Self Care | Payer: BLUE CROSS/BLUE SHIELD | Attending: Family Medicine | Admitting: Family Medicine

## 2018-04-30 ENCOUNTER — Encounter (HOSPITAL_COMMUNITY): Payer: Self-pay | Admitting: *Deleted

## 2018-04-30 ENCOUNTER — Ambulatory Visit (INDEPENDENT_AMBULATORY_CARE_PROVIDER_SITE_OTHER): Payer: BLUE CROSS/BLUE SHIELD

## 2018-04-30 DIAGNOSIS — J209 Acute bronchitis, unspecified: Secondary | ICD-10-CM

## 2018-04-30 MED ORDER — IPRATROPIUM-ALBUTEROL 0.5-2.5 (3) MG/3ML IN SOLN
RESPIRATORY_TRACT | Status: AC
  Filled 2018-04-30: qty 3

## 2018-04-30 MED ORDER — IPRATROPIUM-ALBUTEROL 0.5-2.5 (3) MG/3ML IN SOLN
3.0000 mL | Freq: Once | RESPIRATORY_TRACT | Status: AC
  Administered 2018-04-30: 3 mL via RESPIRATORY_TRACT

## 2018-04-30 MED ORDER — ALBUTEROL SULFATE HFA 108 (90 BASE) MCG/ACT IN AERS: 1.0000 | INHALATION_SPRAY | Freq: Four times a day (QID) | RESPIRATORY_TRACT | 0 refills | Status: DC | PRN

## 2018-04-30 MED ORDER — PREDNISONE 20 MG PO TABS: 40.0000 mg | ORAL_TABLET | Freq: Every day | ORAL | 0 refills | Status: AC

## 2018-04-30 MED ORDER — AZITHROMYCIN 250 MG PO TABS: ORAL_TABLET | ORAL | 0 refills | Status: AC

## 2018-04-30 NOTE — ED Provider Notes (Signed)
[facility]    CSN: [id number] Arrival date & time: 04/30/18  4098     History   Chief Complaint Chief Complaint  Patient presents with  . Chest Pain  . Shortness of Breath    HPI Russell Lam is a 27 y.o. male.   Russell Lam presents with complaints of productive cough, wheezing, shortness of breath , mild sore throat, which started two days ago. No known fevers. No ear pain. No rash. No known ill contacts. Has been taking robitussin as well as cough drops which have minimally helped with symptoms. States he thinks he had asthma as a child but not since, does not have an inhaler. Doesn't smoke. No gi/gu complaints. Without contributing medical history.      ROS per HPI.      History reviewed. No pertinent past medical history.  There are no active problems to display for this patient.   Past Surgical History:  Procedure Laterality Date  . BACK SURGERY         Home Medications    Prior to Admission medications   Medication Sig Start Date End Date Taking? Authorizing Provider  albuterol (PROAIR HFA) 108 (90 Base) MCG/ACT inhaler Inhale 1-2 puffs into the lungs every 6 (six) hours as needed for wheezing or shortness of breath. 04/30/18   Georgetta Haber, NP  azithromycin (ZITHROMAX) 250 MG tablet Take 2 tablets (500 mg total) by mouth daily for 1 day, THEN 1 tablet (250 mg total) daily for 4 days. 04/30/18 05/05/18  Georgetta Haber, NP  meloxicam (MOBIC) 15 MG tablet Take 1 tablet (15 mg total) by mouth daily. 04/04/14   Reuben Likes, MD  methocarbamol (ROBAXIN) 500 MG tablet Take 1 tablet (500 mg total) by mouth 3 (three) times daily. 04/04/14   Reuben Likes, MD  ondansetron (ZOFRAN ODT) 4 MG disintegrating tablet Allow 1-2 tablets to dissolve in your mouth every 8 hours as needed for nausea/vomiting 09/16/17   Loleta Rose, MD  permethrin (ELIMITE) 5 % cream Use as directed on bottle, repeat in 1 wk. 09/14/13   Linna Hoff, MD  potassium chloride SA  (KLOR-CON M20) 20 MEQ tablet Take 1 tablet (20 mEq total) by mouth daily. 09/16/17   Loleta Rose, MD  predniSONE (DELTASONE) 20 MG tablet Take 2 tablets (40 mg total) by mouth daily with breakfast for 5 days. 04/30/18 05/05/18  Georgetta Haber, NP    Family History No family history on file.  Social History Social History   Tobacco Use  . Smoking status: Never Smoker  . Smokeless tobacco: Never Used  Substance Use Topics  . Alcohol use: Yes  . Drug use: No     Allergies   Patient has no known allergies.   Review of Systems Review of Systems   Physical Exam Triage Vital Signs ED Triage Vitals  Enc Vitals Group     BP 04/30/18 0815 124/80     Pulse Rate 04/30/18 0815 (!) 105     Resp 04/30/18 0815 18     Temp 04/30/18 0815 98.5 F (36.9 C)     Temp Source 04/30/18 0815 Oral     SpO2 04/30/18 0815 92 %     Weight --      Height --      Head Circumference --      Peak Flow --      Pain Score 04/30/18 0817 8     Pain Loc --  Pain Edu? --      Excl. in GC? --    No data found.  Updated Vital Signs BP 124/80 (BP Location: Left Arm)   Pulse (!) 105   Temp 98.5 F (36.9 C) (Oral)   Resp 18   SpO2 92%    Physical Exam  Constitutional: He is oriented to person, place, and time. He appears well-developed and well-nourished.  HENT:  Head: Normocephalic and atraumatic.  Right Ear: Tympanic membrane, external ear and ear canal normal.  Left Ear: Tympanic membrane, external ear and ear canal normal.  Nose: Rhinorrhea present. Right sinus exhibits no maxillary sinus tenderness and no frontal sinus tenderness. Left sinus exhibits no maxillary sinus tenderness and no frontal sinus tenderness.  Mouth/Throat: Uvula is midline, oropharynx is clear and moist and mucous membranes are normal.  Eyes: Pupils are equal, round, and reactive to light. Conjunctivae are normal.  Neck: Normal range of motion.  Cardiovascular: Regular rhythm. Tachycardia present.    Pulmonary/Chest: Effort normal. No respiratory distress. He has wheezes.  Lymphadenopathy:    He has no cervical adenopathy.  Neurological: He is alert and oriented to person, place, and time.  Skin: Skin is warm and dry.  Vitals reviewed.    UC Treatments / Results  Labs (all labs ordered are listed, but only abnormal results are displayed) Labs Reviewed - No data to display  EKG None  Radiology Dg Chest 2 View  Result Date: 04/30/2018 CLINICAL DATA:  Pleuritic and post-tussive chest pain, congestion, and cough for the past 2 days. History of childhood asthma, bronchitis, nonsmoker. EXAM: CHEST - 2 VIEW COMPARISON:  Portable chest x-ray dated April 14, 2011 FINDINGS: The lungs are well-expanded. The interstitial markings are coarse. There is no alveolar infiltrate or pleural effusion. The heart and pulmonary vascularity are normal. The mediastinum is normal in width. The bony thorax is unremarkable. IMPRESSION: Mildly increased interstitial markings bilaterally may reflect reactive airway disease or bronchitic change is. There is no alveolar pneumonia nor CHF. Electronically Signed   By: David  SwazilandJordan M.D.   On: 04/30/2018 09:06    Procedures Procedures (including critical care time)  Medications Ordered in UC Medications  ipratropium-albuterol (DUONEB) 0.5-2.5 (3) MG/3ML nebulizer solution 3 mL (3 mLs Nebulization Given 04/30/18 0835)    Initial Impression / Assessment and Plan / UC Course  I have reviewed the triage vital signs and the nursing notes.  Pertinent labs & imaging results that were available during my care of the patient were reviewed by me and considered in my medical decision making (see chart for details).     Still with mild wheezes s/p neb in clinic, o2 did improve, patient states breathing has improved somewhat. Concern for bronchitis on chest xay, consistent with exam. Course of azithromycin, prednisone, inhaler provided. Return precautions provided.  Patient verbalized understanding and agreeable to plan.  Ambulatory out of clinic without difficulty.    Final Clinical Impressions(s) / UC Diagnoses   Final diagnoses:  Acute bronchitis, unspecified organism     Discharge Instructions     Push fluids to ensure adequate hydration and keep secretions thin.  Tylenol and/or ibuprofen as needed for pain or fevers.  Inhaler every 4-6 hours for wheezing and shortness of breath .  Complete course of antibiotics.  5 days of prednisone should also help with your wheezing.  If develop worsening of shortness of breath , difficulty breathing, fevers, or otherwise worsening please return or go to the Er.     ED  Prescriptions    Medication Sig Dispense Auth. Provider   azithromycin (ZITHROMAX) 250 MG tablet Take 2 tablets (500 mg total) by mouth daily for 1 day, THEN 1 tablet (250 mg total) daily for 4 days. 6 tablet Linus Mako B, NP   albuterol (PROAIR HFA) 108 (90 Base) MCG/ACT inhaler Inhale 1-2 puffs into the lungs every 6 (six) hours as needed for wheezing or shortness of breath. 1 Inhaler Linus Mako B, NP   predniSONE (DELTASONE) 20 MG tablet Take 2 tablets (40 mg total) by mouth daily with breakfast for 5 days. 10 tablet Georgetta Haber, NP     Controlled Substance Prescriptions [place] Controlled Substance Registry consulted? Not Applicable   Georgetta Haber, NP 04/30/18 [phone]

## 2018-04-30 NOTE — ED Triage Notes (Signed)
C/o chest pain ans sob onset 2 days ago c/o coughing productive cough, chest pain worse with movement or inspiration.

## 2018-04-30 NOTE — Discharge Instructions (Signed)
Push fluids to ensure adequate hydration and keep secretions thin.  Tylenol and/or ibuprofen as needed for pain or fevers.  Inhaler every 4-6 hours for wheezing and shortness of breath .  Complete course of antibiotics.  5 days of prednisone should also help with your wheezing.  If develop worsening of shortness of breath , difficulty breathing, fevers, or otherwise worsening please return or go to the Er.

## 2018-10-12 ENCOUNTER — Ambulatory Visit (INDEPENDENT_AMBULATORY_CARE_PROVIDER_SITE_OTHER): Payer: BLUE CROSS/BLUE SHIELD

## 2018-10-12 ENCOUNTER — Ambulatory Visit
Admission: EM | Admit: 2018-10-12 | Discharge: 2018-10-12 | Disposition: A | Discharge status: Home / Self Care | Payer: BLUE CROSS/BLUE SHIELD | Attending: Emergency Medicine | Admitting: Emergency Medicine

## 2018-10-12 DIAGNOSIS — J069 Acute upper respiratory infection, unspecified: Secondary | ICD-10-CM

## 2018-10-12 DIAGNOSIS — J4521 Mild intermittent asthma with (acute) exacerbation: Secondary | ICD-10-CM | POA: Diagnosis not present

## 2018-10-12 LAB — POCT RAPID STREP A (OFFICE): Rapid Strep A Screen: NEGATIVE

## 2018-10-12 MED ORDER — IPRATROPIUM-ALBUTEROL 0.5-2.5 (3) MG/3ML IN SOLN
3.0000 mL | Freq: Once | RESPIRATORY_TRACT | Status: AC
  Administered 2018-10-12: 3 mL via RESPIRATORY_TRACT

## 2018-10-12 MED ORDER — ALBUTEROL SULFATE HFA 108 (90 BASE) MCG/ACT IN AERS: 1.0000 | INHALATION_SPRAY | Freq: Four times a day (QID) | RESPIRATORY_TRACT | 0 refills | Status: AC | PRN

## 2018-10-12 MED ORDER — IBUPROFEN 600 MG PO TABS: 600.0000 mg | ORAL_TABLET | Freq: Four times a day (QID) | ORAL | 0 refills | Status: AC | PRN

## 2018-10-12 MED ORDER — AEROCHAMBER PLUS MISC: 2 refills | Status: AC

## 2018-10-12 MED ORDER — AMOXICILLIN 500 MG PO TABS: 1000.0000 mg | ORAL_TABLET | Freq: Three times a day (TID) | ORAL | 0 refills | Status: AC

## 2018-10-12 MED ORDER — AZITHROMYCIN 250 MG PO TABS: 250.0000 mg | ORAL_TABLET | Freq: Every day | ORAL | 0 refills | Status: AC

## 2018-10-12 MED ORDER — FLUTICASONE PROPIONATE 50 MCG/ACT NA SUSP: 2.0000 | Freq: Every day | NASAL | 0 refills | Status: AC

## 2018-10-12 NOTE — ED Provider Notes (Signed)
HPI  SUBJECTIVE:  Russell Lam is a 28 y.o. male who presents with sore throat, chest tightness, wheezing, shortness of breath, greenish nasal congestion, rhinorrhea starting yesterday.  Reports a cough productive of the same material as his nasal congestion.  Reports fevers of 101.  No postnasal drip.  No sensation of throat swelling shut, difficulty breathing, drooling, trismus, muffled voice, body aches, headaches, abdominal pain, rash, neck stiffness.  No antibiotics in the past 3 months.  No antipyretic in the past 4 to 6 hours.  No sinus pain or pressure, contacts with strep.  He tried Robitussin without improvement of symptoms.  No other aggravating or alleviating factors.  He has a past medical history of asthma.  He does not smoke, vape.  No history of diabetes, hypertension.  Did not get a flu shot this year.  PMD: None.  History reviewed. No pertinent past medical history.  Past Surgical History:  Procedure Laterality Date  . BACK SURGERY      No family history on file.  Social History   Tobacco Use  . Smoking status: Never Smoker  . Smokeless tobacco: Never Used  Substance Use Topics  . Alcohol use: Yes  . Drug use: No    No current facility-administered medications for this encounter.   Current Outpatient Medications:  .  albuterol (PROVENTIL HFA;VENTOLIN HFA) 108 (90 Base) MCG/ACT inhaler, Inhale 1-2 puffs into the lungs every 6 (six) hours as needed for wheezing or shortness of breath., Disp: 1 Inhaler, Rfl: 0 .  amoxicillin (AMOXIL) 500 MG tablet, Take 2 tablets (1,000 mg total) by mouth 3 (three) times daily for 5 days., Disp: 30 tablet, Rfl: 0 .  azithromycin (ZITHROMAX) 250 MG tablet, Take 1 tablet (250 mg total) by mouth daily. 2 tabs po on day 1, 1 tab po on days 2-5, Disp: 6 tablet, Rfl: 0 .  fluticasone (FLONASE) 50 MCG/ACT nasal spray, Place 2 sprays into both nostrils daily., Disp: 16 g, Rfl: 0 .  ibuprofen (ADVIL,MOTRIN) 600 MG tablet, Take 1 tablet (600 mg  total) by mouth every 6 (six) hours as needed., Disp: 30 tablet, Rfl: 0 .  potassium chloride SA (KLOR-CON M20) 20 MEQ tablet, Take 1 tablet (20 mEq total) by mouth daily., Disp: 7 tablet, Rfl: 0 .  Spacer/Aero-Holding Chambers (AEROCHAMBER PLUS) inhaler, Use as instructed, Disp: 1 each, Rfl: 2  No Known Allergies   ROS  As noted in HPI.   Physical Exam  BP 133/86 (BP Location: Left Arm)   Pulse 93   Temp 98.7 F (37.1 C) (Oral)   Resp 16   SpO2 95%   Constitutional: Well developed, well nourished, no acute distress Eyes:  EOMI, conjunctiva normal bilaterally HENT: Normocephalic, atraumatic,mucus membranes moist, positive clear rhinorrhea.  Erythematous, swollen turbinates.  No maxillary, frontal sinus tenderness.  Erythematous oropharynx, normal tonsils.  No exudates.  Uvula midline.  No obvious postnasal drip. Neck: No cervical lymphadenopathy Respiratory: Normal inspiratory effort, diffuse wheezing throughout Cardiovascular: Normal rate, regular rhythm, no murmurs rubs or gallops GI: nondistended, no splenomegaly skin: No rash, skin intact Musculoskeletal: no deformities Neurologic: Alert & oriented x 3, no focal neuro deficits Psychiatric: Speech and behavior appropriate   ED Course   Medications  ipratropium-albuterol (DUONEB) 0.5-2.5 (3) MG/3ML nebulizer solution 3 mL (3 mLs Nebulization Given 10/12/18 1146)    Orders Placed This Encounter  Procedures  . DG Chest 2 View    Standing Status:   Standing    Number of Occurrences:  1    Order Specific Question:   Reason for Exam (SYMPTOM  OR DIAGNOSIS REQUIRED)    Answer:   fever cough r/o PNA  . POCT rapid strep A    Standing Status:   Standing    Number of Occurrences:   1    Results for orders placed or performed during the hospital encounter of 10/12/18 (from the past 24 hour(s))  POCT rapid strep A     Status: None   Collection Time: 10/12/18 11:45 AM  Result Value Ref Range   Rapid Strep A Screen Negative  Negative   Dg Chest 2 View  Result Date: 10/12/2018 CLINICAL DATA:  Fever and cough.  Rule out pneumonia. EXAM: CHEST - 2 VIEW COMPARISON:  None. FINDINGS: Normal heart size and mediastinal contours. No acute infiltrate or edema. No effusion or pneumothorax. No acute osseous findings. IMPRESSION: Negative chest. Electronically Signed   By: Marnee Spring M.D.   On: 10/12/2018 11:58    ED Clinical Impression  Upper respiratory tract infection, unspecified type  Mild intermittent asthma with acute exacerbation   ED Assessment/Plan  Checking strep, chest x-ray.  Giving DuoNeb due to the wheezing.  We will evaluate  Rapid strep negative.  Reviewed imaging independently.  No pneumonia.  See radiology report for full details.  Reevaluation, patient states that he feels better.  Repeat exam: His lungs are clear.  Improved air movement.  Doubt flu, patient denies body aches, headaches.  While his x-ray is negative for pneumonia, concern that he may be developing one especially with a fever of 101.  Sending home with regular bronchodilators for the next 4 days, albuterol inhaler with a spacer, Mucinex, Flonase, ibuprofen 600 mg combined with 1 g of Tylenol.  Amoxicillin 1 g 3 times daily for 5 days, and azithromycin Z-Pak for presumed pneumonia.  Discussed labs, imaging, MDM, treatment plan, and plan for follow-up with patient. Discussed sn/sx that should prompt return to the ED. patient agrees with plan.   Meds ordered this encounter  Medications  . ipratropium-albuterol (DUONEB) 0.5-2.5 (3) MG/3ML nebulizer solution 3 mL  . albuterol (PROVENTIL HFA;VENTOLIN HFA) 108 (90 Base) MCG/ACT inhaler    Sig: Inhale 1-2 puffs into the lungs every 6 (six) hours as needed for wheezing or shortness of breath.    Dispense:  1 Inhaler    Refill:  0  . fluticasone (FLONASE) 50 MCG/ACT nasal spray    Sig: Place 2 sprays into both nostrils daily.    Dispense:  16 g    Refill:  0  . Spacer/Aero-Holding  Chambers (AEROCHAMBER PLUS) inhaler    Sig: Use as instructed    Dispense:  1 each    Refill:  2  . ibuprofen (ADVIL,MOTRIN) 600 MG tablet    Sig: Take 1 tablet (600 mg total) by mouth every 6 (six) hours as needed.    Dispense:  30 tablet    Refill:  0  . azithromycin (ZITHROMAX) 250 MG tablet    Sig: Take 1 tablet (250 mg total) by mouth daily. 2 tabs po on day 1, 1 tab po on days 2-5    Dispense:  6 tablet    Refill:  0  . amoxicillin (AMOXIL) 500 MG tablet    Sig: Take 2 tablets (1,000 mg total) by mouth 3 (three) times daily for 5 days.    Dispense:  30 tablet    Refill:  0    *This clinic note was created using Dragon dictation  software. Therefore, there may be occasional mistakes despite careful proofreading.   ?   Domenick Gong, MD 10/12/18 [phone]

## 2018-10-12 NOTE — ED Triage Notes (Signed)
Patient complains of sore throat, cough, wheezing, and chest pain X 1 day. Patient took OTC cold/flu medication last night.

## 2018-10-12 NOTE — Discharge Instructions (Addendum)
2 puffs with your albuterol inhaler every 4 hours for the next 2 days, then 2 puffs every 6 hours for 2 days after that, then you may use it as needed.  Make sure you use a spacer every time using albuterol inhaler.  Start some Flonase, saline nasal irrigation with a Neil med rinse and distilled water as often as you want, Mucinex.  Ibuprofen 600 mg combined with 1 g of Tylenol as needed for pain, fever.  Finish the antibiotics, even if you feel better.  Follow-up with a primary care physician, see list below.  Below is a list of primary care practices who are taking new patients for you to follow-up with.  [facility] [address] [place], [place] [address] [phone]  [facility] [address] [place], [place] [address] [phone]  [facility] [phone] [address] [place] [address]  [facility]: [address] [place] [address]  [phone]  [facility]: [address] [place] [address]   [phone]  [facility]: [address] [place] [address]  [phone]  [facility] : [address] [place] [address] [phone]  [facility]: [address] [place] [address] [phone]  [facility]: [address] [place] [address] [phone]  Dr. Oneal Grout [address] [facility] [place] [address]  [phone]  Dr. Jackie Plum, [facility]. [address] [place], [place] [address]  [phone]  Go to www.goodrx.com to look up your medications. This will give you a list of where you can find your prescriptions at the most affordable prices. Or ask the  pharmacist what the cash price is, or if they have any other discount programs available to help make your medication more affordable. This can be less expensive than what you would pay with insurance.

## 2018-10-12 NOTE — ED Triage Notes (Signed)
Pt c/o productive cough with green sputum and sore throat since yesterday

## 2018-12-27 IMAGING — DX DG CHEST 2V
2 series · 2 of 2 positions shown · non-contrast
Comparison: Portable chest x-ray dated April 14, 2011

CLINICAL DATA: Pleuritic and post-tussive chest pain, congestion,
and cough for the past 2 days. History of childhood asthma,
bronchitis, nonsmoker.

EXAM:
CHEST - 2 VIEW

[chest pa]
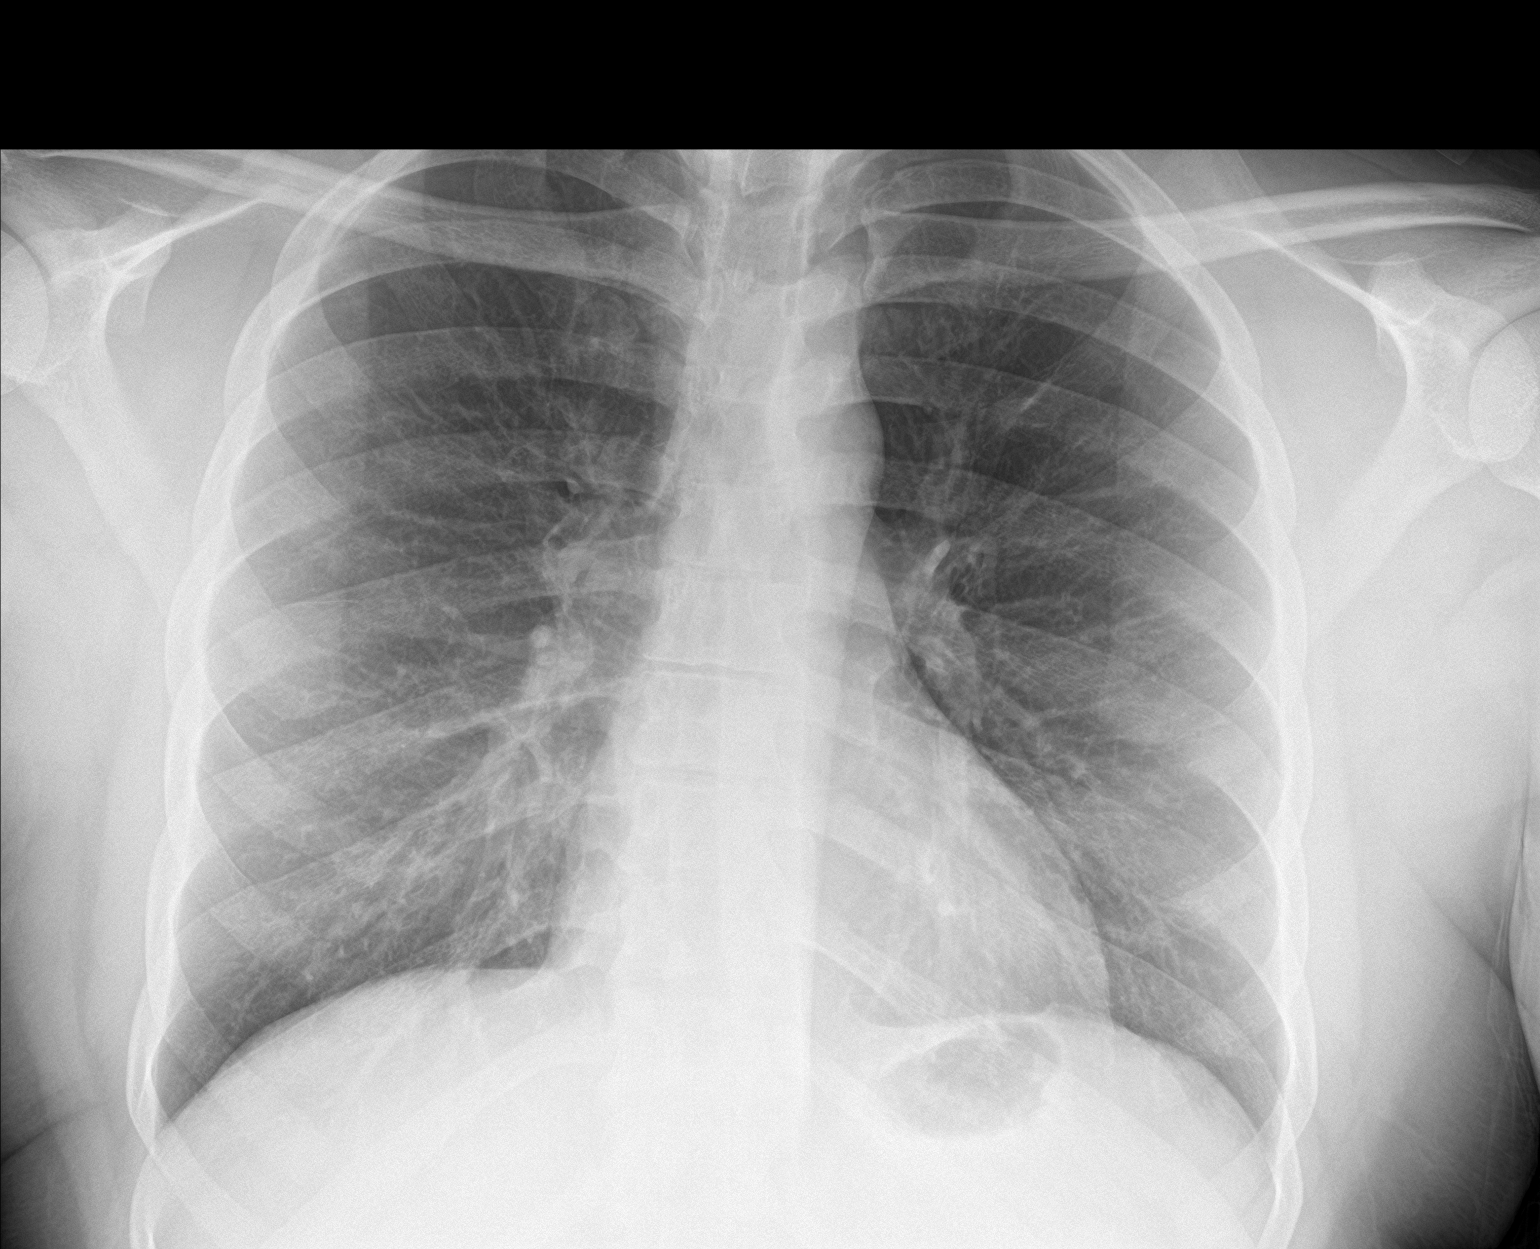

[chest lat]
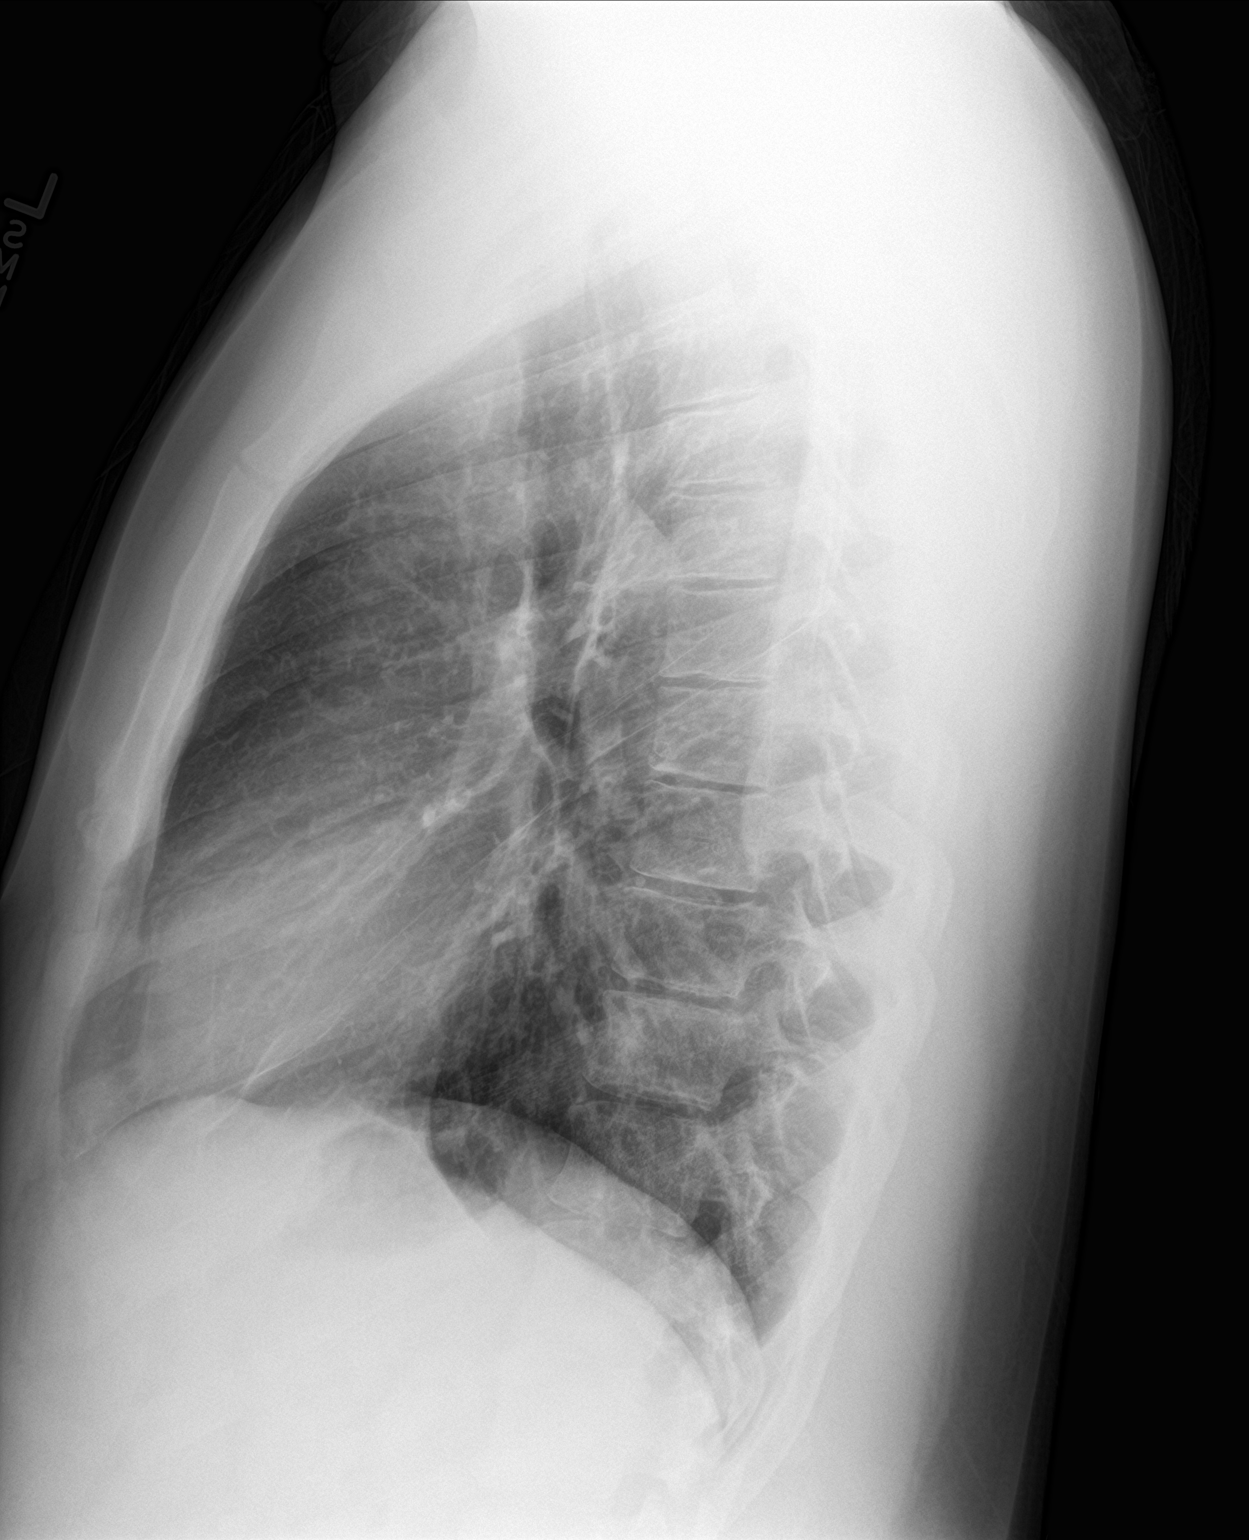

[2 of 2 positions shown; findings below may reference images not displayed]

FINDINGS: The lungs are well-expanded. The interstitial markings are coarse.
There is no alveolar infiltrate or pleural effusion. The heart and
pulmonary vascularity are normal. The mediastinum is normal in
width. The bony thorax is unremarkable.
IMPRESSION: Mildly increased interstitial markings bilaterally may reflect
reactive airway disease or bronchitic change is. There is no
alveolar pneumonia nor CHF.

## 2019-03-29 ENCOUNTER — Other Ambulatory Visit: Payer: Self-pay

## 2019-03-29 DIAGNOSIS — Z20822 Contact with and (suspected) exposure to covid-19: Secondary | ICD-10-CM

## 2019-03-30 LAB — NOVEL CORONAVIRUS, NAA: SARS-CoV-2, NAA: NOT DETECTED

## 2019-06-09 ENCOUNTER — Other Ambulatory Visit: Payer: Self-pay | Admitting: *Deleted

## 2019-06-09 DIAGNOSIS — Z20822 Contact with and (suspected) exposure to covid-19: Secondary | ICD-10-CM

## 2019-06-10 LAB — NOVEL CORONAVIRUS, NAA: SARS-CoV-2, NAA: NOT DETECTED

## 2022-05-17 ENCOUNTER — Ambulatory Visit
Admission: RE | Admit: 2022-05-17 | Discharge: 2022-05-17 | Disposition: A | Discharge status: Home / Self Care | Payer: 59 | Source: Ambulatory Visit | Attending: Internal Medicine | Admitting: Internal Medicine

## 2022-05-17 VITALS — BP 129/83 | HR 81 | Temp 98.7°F | Resp 16

## 2022-05-17 DIAGNOSIS — R0789 Other chest pain: Secondary | ICD-10-CM

## 2022-05-17 MED ORDER — CYCLOBENZAPRINE HCL 5 MG PO TABS: 5.0000 mg | ORAL_TABLET | Freq: Two times a day (BID) | ORAL | 0 refills | Status: AC | PRN

## 2022-05-17 NOTE — ED Triage Notes (Addendum)
Reports last night he started noticing a sharp pain that he describes as being near the center of his chest as he points towards his zyphoid process. States it's brought on with certain movements, like lifting, turning, leaning back, or taking a deep breath. 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 Has not tried any medications at home to help. No pain at rest

## 2022-05-17 NOTE — Discharge Instructions (Addendum)
As we discussed, this appears to be costochondritis versus muscle strain of your chest.  This is being treated with pain medication as well as muscle relaxer.  Please follow-up if symptoms persist or worsen.

## 2022-05-17 NOTE — ED Provider Notes (Signed)
[facility]    CSN: [id number] Arrival date & time: 05/17/22  1114      History   Chief Complaint Chief Complaint  Patient presents with   Chest Injury    Entered by patient    HPI Russell Lam is a 31 y.o. male.   Patient presents with centralized sharp intermittent chest pain that started yesterday.  Denies any obvious injury or recent heavy lifting.  Patient reports that movement exacerbates pain.  Patient reports only health history is allergies and asthma.  He has not had any shortness of breath, headache, dizziness, blurred vision, nausea, vomiting.  Patient denies any complications with asthma recently.  Denies any associated cough.  Patient has not taken any medications to alleviate symptoms.     History reviewed. No pertinent past medical history.  There are no problems to display for this patient.   Past Surgical History:  Procedure Laterality Date   BACK SURGERY         Home Medications    Prior to Admission medications   Medication Sig Start Date End Date Taking? Authorizing Provider  cyclobenzaprine (FLEXERIL) 5 MG tablet Take 1 tablet (5 mg total) by mouth 2 (two) times daily as needed for muscle spasms. 05/17/22  Yes , Acie Fredrickson, FNP  albuterol (PROVENTIL HFA;VENTOLIN HFA) 108 (90 Base) MCG/ACT inhaler Inhale 1-2 puffs into the lungs every 6 (six) hours as needed for wheezing or shortness of breath. 10/12/18   Domenick Gong, MD  azithromycin (ZITHROMAX) 250 MG tablet Take 1 tablet (250 mg total) by mouth daily. 2 tabs po on day 1, 1 tab po on days 2-5 10/12/18   Domenick Gong, MD  fluticasone [facility]) 50 MCG/ACT nasal spray Place 2 sprays into both nostrils daily. 10/12/18   Domenick Gong, MD  ibuprofen (ADVIL,MOTRIN) 600 MG tablet Take 1 tablet (600 mg total) by mouth every 6 (six) hours as needed. 10/12/18   Domenick Gong, MD  potassium chloride SA (KLOR-CON M20) 20 MEQ tablet Take 1 tablet (20 mEq total) by mouth daily. 09/16/17    Loleta Rose, MD  Spacer/Aero-Holding Chambers (AEROCHAMBER PLUS) inhaler Use as instructed 10/12/18   Domenick Gong, MD    Family History History reviewed. No pertinent family history.  Social History Social History   Tobacco Use   Smoking status: Never   Smokeless tobacco: Never  Vaping Use   Vaping Use: Never used  Substance Use Topics   Alcohol use: Yes   Drug use: No     Allergies   Patient has no known allergies.   Review of Systems Review of Systems Per HPI  Physical Exam Triage Vital Signs ED Triage Vitals  Enc Vitals Group     BP 05/17/22 1129 129/83     Pulse Rate 05/17/22 1129 81     Resp 05/17/22 1129 16     Temp 05/17/22 1129 98.7 F (37.1 C)     Temp Source 05/17/22 1129 Oral     SpO2 05/17/22 1129 97 %     Weight --      Height --      Head Circumference --      Peak Flow --      Pain Score 05/17/22 1130 7     Pain Loc --      Pain Edu? --      Excl. in GC? --    No data found.  Updated Vital Signs BP 129/83 (BP Location: Left Arm)   Pulse 81  Temp 98.7 F (37.1 C) (Oral)   Resp 16   SpO2 97%   Visual Acuity Right Eye Distance:   Left Eye Distance:   Bilateral Distance:    Right Eye Near:   Left Eye Near:    Bilateral Near:     Physical Exam Constitutional:      General: He is not in acute distress.    Appearance: Normal appearance. He is not toxic-appearing or diaphoretic.  HENT:     Head: Normocephalic and atraumatic.  Eyes:     Extraocular Movements: Extraocular movements intact.     Conjunctiva/sclera: Conjunctivae normal.     Pupils: Pupils are equal, round, and reactive to light.  Cardiovascular:     Rate and Rhythm: Normal rate and regular rhythm.     Pulses: Normal pulses.     Heart sounds: Normal heart sounds.  Pulmonary:     Effort: Pulmonary effort is normal. No respiratory distress.     Breath sounds: Normal breath sounds.  Chest:     Chest wall: Tenderness present.       Comments: Tenderness to  palpation to center of chest.  No obvious crepitus, discoloration, lacerations, abrasions noted. Abdominal:     General: Bowel sounds are normal. There is no distension.     Palpations: Abdomen is soft.     Tenderness: There is no abdominal tenderness.  Neurological:     General: No focal deficit present.     Mental Status: He is alert and oriented to person, place, and time. Mental status is at baseline.     Cranial Nerves: Cranial nerves 2-12 are intact.     Sensory: Sensation is intact.     Motor: Motor function is intact.     Coordination: Coordination is intact.     Gait: Gait is intact.  Psychiatric:        Mood and Affect: Mood normal.        Behavior: Behavior normal.        Thought Content: Thought content normal.        Judgment: Judgment normal.      UC Treatments / Results  Labs (all labs ordered are listed, but only abnormal results are displayed) Labs Reviewed - No data to display  EKG   Radiology No results found.  Procedures Procedures (including critical care time)  Medications Ordered in UC Medications - No data to display  Initial Impression / Assessment and Plan / UC Course  I have reviewed the triage vital signs and the nursing notes.  Pertinent labs & imaging results that were available during my care of the patient were reviewed by me and considered in my medical decision making (see chart for details).     Differential diagnoses include costochondritis versus chest muscle strain.  There is no concern for cardiac etiology given that pain is reproducible with palpation and movement.  EKG deferred due to to this.  Will treat with over-the-counter pain relievers and muscle relaxer.  After further review of patient's chart, it appears the patient has had elevated creatinine previously so advised patient to avoid NSAIDs.  Patient may take Tylenol and muscle relaxer.  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  Patient voiced understanding.  Patient advised to follow-up if symptoms persist or worsen.  Patient verbalized understanding and was agreeable with plan. Final Clinical Impressions(s) / UC Diagnoses   Final diagnoses:  Musculoskeletal chest pain  Discharge Instructions      As we discussed, this appears to be costochondritis versus muscle strain of your chest.  This is being treated with pain medication as well as muscle relaxer.  Please follow-up if symptoms persist or worsen.    ED Prescriptions     Medication Sig Dispense Auth. Provider   cyclobenzaprine (FLEXERIL) 5 MG tablet Take 1 tablet (5 mg total) by mouth 2 (two) times daily as needed for muscle spasms. 20 tablet [place], [place], [place]      I have reviewed the PDMP during this encounter.   Teodora Medici, [place] 05/17/22 1218

## 2023-02-10 ENCOUNTER — Ambulatory Visit: Payer: Self-pay

## 2023-12-18 ENCOUNTER — Ambulatory Visit: Payer: Self-pay
